# Patient Record
Sex: Male | Born: 1970 | Hispanic: No | Marital: Married | Smoking: Never smoker
Health system: Southern US, Community
[De-identification: ages and names within clinical notes are randomized; demographics above are authoritative.]

## PROBLEM LIST (undated history)

## (undated) DIAGNOSIS — E119 Type 2 diabetes mellitus without complications: Secondary | ICD-10-CM

## (undated) DIAGNOSIS — E785 Hyperlipidemia, unspecified: Secondary | ICD-10-CM

## (undated) DIAGNOSIS — I10 Essential (primary) hypertension: Secondary | ICD-10-CM

## (undated) HISTORY — DX: Hyperlipidemia, unspecified: E78.5

## (undated) HISTORY — DX: Essential (primary) hypertension: I10

---

## 2012-05-27 ENCOUNTER — Encounter (INDEPENDENT_AMBULATORY_CARE_PROVIDER_SITE_OTHER): Payer: Self-pay | Admitting: Surgery

## 2012-05-27 ENCOUNTER — Ambulatory Visit (INDEPENDENT_AMBULATORY_CARE_PROVIDER_SITE_OTHER): Payer: Commercial Managed Care - PPO | Admitting: Surgery

## 2012-05-27 VITALS — BP 136/90 | HR 80 | Temp 98.7°F | Resp 15 | Ht 72.0 in | Wt 209.8 lb

## 2012-05-27 DIAGNOSIS — L723 Sebaceous cyst: Secondary | ICD-10-CM

## 2012-05-27 NOTE — Progress Notes (Signed)
NAMEBRANDUN PINN DOB: [birth year] MRN: [id number]                                                                                      DATE: 05/27/2012  PCP: Gildardo Griffes, MD Referring Provider: Roberts Gaudy., MD  IMPRESSION:  Epidermoid cyst right upper back recently inflamed, 1.5 cm in size  PLAN:   excision of epidermoid cyst of the back under local anesthesia. I discussed this with the patient and we talked about alternatives which basically just continued followup. We talked about doing this under local anesthesia and that he could resume normal activities the same day. He would need to restrict significant physical activity such as golf or tennis for a week or 2 after surgery.                 CC:  Chief Complaint  Patient presents with  . Cyst    of back    HPI:  Charles [name] is a 41 y.o.  male who presents for evaluation of a cyst of the back. This reason a drain a little bit but was treated with some antibiotic cream and settled down. Is been present for a while. He is currently asymptomatic. He has noticed a small open area over the top of the cystic mass.  PMH:  has a past medical history of Hyperlipidemia and Hypertension.  PSH:   has no past surgical history on file.  ALLERGIES:  No Known Allergies  MEDICATIONS: Current outpatient prescriptions:GEMFIBROZIL PO, Take by mouth., Disp: , Rfl: ;  lisinopril (PRINIVIL,ZESTRIL) 5 MG tablet, Take 5 mg by mouth daily., Disp: , Rfl:   ROS: He has filled out our 12 point review of systems and it is negative  . EXAM:   Gen.: Patient alert oriented healthy-appearing no acute distress. Vital signs:BP 136/90  Pulse 80  Temp 98.7 F (37.1 C)  Resp 15  Ht 6' (1.829 m)  Wt 209 lb 12.8 oz (95.165 kg)  BMI 28.45 kg/m2 Back: On the upper right back is a 1.5 cm epidermoid cyst well circumscribed with a small opening onto the skin. It is adjacent to a transverse large tattoo.  DATA REVIEWED:  None     Starleen Trussell  J 05/27/2012  CC: Roberts Gaudy., MD, Gildardo Griffes, MD

## 2012-05-27 NOTE — Patient Instructions (Signed)
We will schedule outpatient surgery to remove the cyst from your back using local anesthesia

## 2012-06-17 ENCOUNTER — Ambulatory Visit (HOSPITAL_BASED_OUTPATIENT_CLINIC_OR_DEPARTMENT_OTHER)
Admission: RE | Admit: 2012-06-17 | Discharge: 2012-06-17 | Disposition: A | Discharge status: Home / Self Care | Payer: 59 | Source: Ambulatory Visit | Attending: Surgery | Admitting: Surgery

## 2012-06-17 ENCOUNTER — Encounter (HOSPITAL_BASED_OUTPATIENT_CLINIC_OR_DEPARTMENT_OTHER)
Admission: RE | Disposition: A | Discharge status: Home / Self Care | Payer: Self-pay | Source: Ambulatory Visit | Attending: Surgery

## 2012-06-17 DIAGNOSIS — L723 Sebaceous cyst: Secondary | ICD-10-CM | POA: Insufficient documentation

## 2012-06-17 DIAGNOSIS — I10 Essential (primary) hypertension: Secondary | ICD-10-CM | POA: Insufficient documentation

## 2012-06-17 HISTORY — PX: MASS EXCISION: SHX2000

## 2012-06-17 SURGERY — MINOR EXCISION OF MASS
Anesthesia: LOCAL | Site: Back | Wound class: Clean

## 2012-06-17 MED ORDER — LIDOCAINE-EPINEPHRINE (PF) 1 %-1:200000 IJ SOLN
INTRAMUSCULAR | Status: DC | PRN
  Administered 2012-06-17: 3.5 mL

## 2012-06-17 MED ORDER — SODIUM BICARBONATE 4 % IV SOLN
INTRAVENOUS | Status: DC | PRN
  Administered 2012-06-17: 1 mL via INTRAVENOUS

## 2012-06-17 SURGICAL SUPPLY — 35 items
BENZOIN TINCTURE PRP APPL 2/3 (GAUZE/BANDAGES/DRESSINGS) IMPLANT
BLADE SURG 15 STRL LF DISP TIS (BLADE) ×1 IMPLANT
BLADE SURG 15 STRL SS (BLADE) ×1
CLOTH BEACON ORANGE TIMEOUT ST (SAFETY) ×2 IMPLANT
DERMABOND ADVANCED (GAUZE/BANDAGES/DRESSINGS) ×1
DERMABOND ADVANCED .7 DNX12 (GAUZE/BANDAGES/DRESSINGS) ×1 IMPLANT
DRAPE SURG 17X23 STRL (DRAPES) IMPLANT
DRSG TEGADERM 4X4.75 (GAUZE/BANDAGES/DRESSINGS) IMPLANT
ELECT REM PT RETURN 9FT ADLT (ELECTROSURGICAL)
ELECTRODE REM PT RTRN 9FT ADLT (ELECTROSURGICAL) IMPLANT
GAUZE SPONGE 4X4 12PLY STRL LF (GAUZE/BANDAGES/DRESSINGS) IMPLANT
GAUZE SPONGE 4X4 16PLY XRAY LF (GAUZE/BANDAGES/DRESSINGS) IMPLANT
GLOVE EUDERMIC 7 POWDERFREE (GLOVE) ×2 IMPLANT
GLOVE SKINSENSE NS SZ7.0 (GLOVE) ×1
GLOVE SKINSENSE STRL SZ7.0 (GLOVE) ×1 IMPLANT
MARKER SKIN DUAL TIP RULER LAB (MISCELLANEOUS) ×2 IMPLANT
NDL SAFETY ECLIPSE 18X1.5 (NEEDLE) ×1 IMPLANT
NEEDLE HYPO 18GX1.5 SHARP (NEEDLE) ×1
NEEDLE HYPO 25X1 1.5 SAFETY (NEEDLE) ×2 IMPLANT
PENCIL BUTTON HOLSTER BLD 10FT (ELECTRODE) IMPLANT
STRIP CLOSURE SKIN 1/2X4 (GAUZE/BANDAGES/DRESSINGS) IMPLANT
SUT ETHILON 3 0 PS 1 (SUTURE) IMPLANT
SUT ETHILON 4 0 PS 2 18 (SUTURE) IMPLANT
SUT MNCRL AB 3-0 PS2 18 (SUTURE) ×2 IMPLANT
SUT MNCRL AB 4-0 PS2 18 (SUTURE) IMPLANT
SUT PROLENE 3 0 PS 2 (SUTURE) IMPLANT
SUT PROLENE 5 0 P 3 (SUTURE) IMPLANT
SUT VIC AB 3-0 FS2 27 (SUTURE) IMPLANT
SUT VIC AB 4-0 BRD 54 (SUTURE) IMPLANT
SUT VIC AB 4-0 P-3 18XBRD (SUTURE) IMPLANT
SUT VIC AB 4-0 P3 18 (SUTURE)
SUT VIC AB 4-0 SH 18 (SUTURE) IMPLANT
SWABSTICK POVIDONE IODINE SNGL (MISCELLANEOUS) ×4 IMPLANT
SYR CONTROL 10ML LL (SYRINGE) ×2 IMPLANT
TOWEL OR 17X24 6PK STRL BLUE (TOWEL DISPOSABLE) IMPLANT

## 2012-06-17 NOTE — H&P (View-Only) (Signed)
NAME: Charles [name] DOB: 04/07/1971 MRN: [id number]                                                                                      DATE: 05/27/2012  PCP: BURNS,KEVIN, MD Referring Provider: Burns, Kevin L., MD  IMPRESSION:  Epidermoid cyst right upper back recently inflamed, 1.5 cm in size  PLAN:   excision of epidermoid cyst of the back under local anesthesia. I discussed this with the patient and we talked about alternatives which basically just continued followup. We talked about doing this under local anesthesia and that he could resume normal activities the same day. He would need to restrict significant physical activity such as golf or tennis for a week or 2 after surgery.                 CC:  Chief Complaint  Patient presents with  . Cyst    of back    HPI:  Charles [name] is a 40 y.o.  male who presents for evaluation of a cyst of the back. This reason a drain a little bit but was treated with some antibiotic cream and settled down. Is been present for a while. He is currently asymptomatic. He has noticed a small open area over the top of the cystic mass.  PMH:  has a past medical history of Hyperlipidemia and Hypertension.  PSH:   has no past surgical history on file.  ALLERGIES:  No Known Allergies  MEDICATIONS: Current outpatient prescriptions:GEMFIBROZIL PO, Take by mouth., Disp: , Rfl: ;  lisinopril (PRINIVIL,ZESTRIL) 5 MG tablet, Take 5 mg by mouth daily., Disp: , Rfl:   ROS: He has filled out our 12 point review of systems and it is negative  . EXAM:   Gen.: Patient alert oriented healthy-appearing no acute distress. Vital signs:BP 136/90  Pulse 80  Temp 98.7 F (37.1 C)  Resp 15  Ht 6' (1.829 m)  Wt 209 lb 12.8 oz (95.165 kg)  BMI 28.45 kg/m2 Back: On the upper right back is a 1.5 cm epidermoid cyst well circumscribed with a small opening onto the skin. It is adjacent to a transverse large tattoo.  DATA REVIEWED:  None     Aneshia Jacquet  J 05/27/2012  CC: Burns, Kevin L., MD, BURNS,KEVIN, MD        

## 2012-06-17 NOTE — Interval H&P Note (Signed)
History and Physical Interval Note:  06/17/2012 10:47 AM  Charles [name]  has presented today for surgery, with the diagnosis of epidermal cyst  The various methods of treatment have been discussed with the patient and family. After consideration of risks, benefits and other options for treatment, the patient has consented to  Procedure(s) (LRB) with comments: MINOR EXCISION OF MASS (N/A) - removal cyst of back as a surgical intervention .  The patient's history has been reviewed, patient examined, no change in status, stable for surgery.  I have reviewed the patient's chart and labs.  Questions were answered to the patient's satisfaction.     [name]

## 2012-06-17 NOTE — Op Note (Signed)
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   Clinical History and Indications: this patient has an epidermoid cyst which has become intermittently inflamed right upper back. He was scheduled electively for excision to prevent recurrences. It was currently not acutely inflamed   Description of Procedure: the patient was seen at the area and the mass identified. It was marked. The patient was taken to the operating room and after a time out was performed the area was infiltrated with 1% Xylocaine with epinephrine. I waited 10 minutes and had excellent anesthesia. I made a short elliptical incision and excised the mass sharply with a knife. It came out intact and in toto. The incision was not bleeding. It was closed with 3-0 Monocryl subcuticular plus Dermabond.  The patient tolerated the procedure well. There no operative complications. All counts were correct.  Currie Paris, MD, FACS 06/17/2012 11:23 AM

## 2012-06-18 ENCOUNTER — Encounter (HOSPITAL_BASED_OUTPATIENT_CLINIC_OR_DEPARTMENT_OTHER): Payer: Self-pay

## 2012-06-18 ENCOUNTER — Encounter (HOSPITAL_BASED_OUTPATIENT_CLINIC_OR_DEPARTMENT_OTHER): Payer: Self-pay | Admitting: Surgery

## 2012-07-04 ENCOUNTER — Encounter (INDEPENDENT_AMBULATORY_CARE_PROVIDER_SITE_OTHER): Payer: Self-pay | Admitting: Surgery

## 2012-07-04 ENCOUNTER — Ambulatory Visit (INDEPENDENT_AMBULATORY_CARE_PROVIDER_SITE_OTHER): Payer: Commercial Managed Care - PPO | Admitting: Surgery

## 2012-07-04 VITALS — BP 150/90 | HR 72 | Temp 97.4°F | Resp 20 | Ht 72.0 in | Wt 211.8 lb

## 2012-07-04 DIAGNOSIS — Z9889 Other specified postprocedural states: Secondary | ICD-10-CM

## 2012-07-04 DIAGNOSIS — L723 Sebaceous cyst: Secondary | ICD-10-CM

## 2012-07-04 NOTE — Patient Instructions (Addendum)
We will see you again on an as needed basis. Please call the office at [phone] if you have any questions or concerns. Thank you for allowing us to take care of you.  

## 2012-07-04 NOTE — Progress Notes (Signed)
NAME: Charles [name]                                            DOB: 08-09-1971 DATE: 07/04/2012                                                  MRN: [id number]  CC: Post op   HPI: This patient comes in for post op follow-up .Heunderwent removal of small epidermoid cyst from the right upper back on 06/17/2012. He feels that he is doing well.  PE:  VITAL SIGNS: BP 150/90  Pulse 72  Temp 97.4 F (36.3 C) (Temporal)  Resp 20  Ht 6' (1.829 m)  Wt 211 lb 12.8 oz (96.072 kg)  BMI 28.73 kg/m2  General: The patient appears to be healthy, NAD Incision is healing nicely  DATA REVIEWED: Pathology report showed some scarring and inflammatory changes or chronic  IMPRESSION: The patient is doing well S/P cyst removed    PLAN: Recheck here on an as-needed basis. Discuss his path report and a copy.

## 2013-09-21 ENCOUNTER — Encounter: Payer: Self-pay | Admitting: Oncology

## 2013-09-21 NOTE — Progress Notes (Signed)
This chart was opened by error

## 2013-12-02 ENCOUNTER — Encounter (INDEPENDENT_AMBULATORY_CARE_PROVIDER_SITE_OTHER): Payer: Self-pay | Admitting: General Surgery

## 2013-12-02 ENCOUNTER — Ambulatory Visit (INDEPENDENT_AMBULATORY_CARE_PROVIDER_SITE_OTHER): Payer: Commercial Managed Care - PPO | Admitting: General Surgery

## 2013-12-02 VITALS — BP 128/76 | HR 68 | Temp 98.3°F | Resp 15 | Ht 72.0 in | Wt 226.0 lb

## 2013-12-02 DIAGNOSIS — L723 Sebaceous cyst: Secondary | ICD-10-CM | POA: Insufficient documentation

## 2013-12-02 NOTE — Patient Instructions (Signed)
Please call [phone] and ask for scheduling if you decide to go ahead with surgery.

## 2013-12-02 NOTE — Progress Notes (Signed)
Patient ID: Charles [name], male   DOB: Aug 31, 1971, 43 y.o.   MRN: [id number]  Chief Complaint  Patient presents with  . New Evaluation    eval cyst upper right back    HPI Charles [name] is a 43 y.o. male.  Chief complaint: Recurrent sebaceous cyst right back HPI Patient is known to Dr. Margot Chimes from our practice status post excision of sebaceous cyst of the right back in 2013. The area has been getting larger recently and his primary care physician was concerned it had recurred.  He scheduled an appointment for reevaluation. Of note, he has developed an additional small area on his right posterior neck that he would like evaluated. There is occasional drainage of material from the skin but there is been no acute pain or redness recently. Past Medical History  Diagnosis Date  . Hyperlipidemia   . Hypertension     Past Surgical History  Procedure Laterality Date  . Mass excision  06/17/2012    Procedure: MINOR EXCISION OF MASS;  Surgeon: Haywood Lasso, MD;  Location: [facility];  Service: General;  Laterality: N/A;  Removal Cyst of Right Upper Back    Family History  Problem Relation Age of Onset  . Lymphoma Mother   . Cancer Mother     lymphoma  . Heart disease Father     Social History History  Substance Use Topics  . Smoking status: Never Smoker   . Smokeless tobacco: Never Used  . Alcohol Use: Yes    No Known Allergies  Current Outpatient Prescriptions  Medication Sig Dispense Refill  . GEMFIBROZIL PO Take by mouth.      Marland Kitchen lisinopril (PRINIVIL,ZESTRIL) 5 MG tablet Take 5 mg by mouth daily.       No current facility-administered medications for this visit.    Review of Systems Review of Systems  Constitutional: Negative for fever, chills and unexpected weight change.  HENT: Negative for congestion, hearing loss, sore throat, trouble swallowing and voice change.   Eyes: Negative for visual disturbance.  Respiratory: Negative for cough and  wheezing.   Cardiovascular: Negative for chest pain, palpitations and leg swelling.  Gastrointestinal: Negative for nausea, vomiting, abdominal pain, diarrhea, constipation, blood in stool, abdominal distention, anal bleeding and rectal pain.  Genitourinary: Negative for hematuria and difficulty urinating.  Musculoskeletal: Negative for arthralgias.  Skin: Negative for rash and wound.       See history of present illness  Neurological: Negative for seizures, syncope, weakness and headaches.  Hematological: Negative for adenopathy. Does not bruise/bleed easily.  Psychiatric/Behavioral: Negative for confusion.    Blood pressure 128/76, pulse 68, temperature 98.3 F (36.8 C), temperature source Oral, resp. rate 15, height 6' (1.829 m), weight 226 lb (102.513 kg).  Physical Exam Physical Exam  Constitutional: He is oriented to person, place, and time. He appears well-developed and well-nourished. No distress.  HENT:  Head: Normocephalic and atraumatic.  Eyes: EOM are normal. Pupils are equal, round, and reactive to light.  Neck: Normal range of motion. Neck supple. No tracheal deviation present. No thyromegaly present.  Right posterior neck has 8 mm palpable subcutaneous mass without overlying skin changes  Cardiovascular: Normal rate and normal heart sounds.   Pulmonary/Chest: Effort normal and breath sounds normal. No stridor. No respiratory distress. He has no wheezes. He has no rales.    2 cm subcutaneous mass beneath previous scar right back at lateral edge of tattoo  Abdominal: Soft. Bowel sounds are normal. He  exhibits no distension. There is no tenderness. There is no rebound.  Musculoskeletal: Normal range of motion.  Neurological: He is alert and oriented to person, place, and time.  Skin: Skin is warm.  See above   Assessment    Recurrent sebaceous cyst right back, sebaceous cyst right posterior neck    Plan    I have offered excision of both in the operating room  under general anesthesia in prone position. Procedure, risks, benefits were discussed in detail with him and his wife. He is reluctant to have further surgery in the back to think about it. If he decides to go forward he will call.    Brendaliz Kuk E 12/02/2013, 12:21 PM

## 2015-04-19 ENCOUNTER — Other Ambulatory Visit (HOSPITAL_COMMUNITY): Payer: Self-pay | Admitting: Otolaryngology

## 2015-04-19 DIAGNOSIS — G519 Disorder of facial nerve, unspecified: Secondary | ICD-10-CM

## 2015-04-19 DIAGNOSIS — H6521 Chronic serous otitis media, right ear: Secondary | ICD-10-CM

## 2015-04-19 DIAGNOSIS — H6061 Unspecified chronic otitis externa, right ear: Secondary | ICD-10-CM

## 2015-10-03 MED FILL — GEMFIBROZIL 600 MG TABLET: 600 | 90 days supply | Qty: 180 | Fill #1

## 2015-10-31 DIAGNOSIS — J3089 Other allergic rhinitis: Secondary | ICD-10-CM | POA: Diagnosis not present

## 2015-10-31 DIAGNOSIS — E785 Hyperlipidemia, unspecified: Secondary | ICD-10-CM | POA: Diagnosis not present

## 2015-10-31 DIAGNOSIS — I10 Essential (primary) hypertension: Secondary | ICD-10-CM | POA: Diagnosis not present

## 2015-10-31 MED FILL — IPRATROPIUM 0.03% SPRAY: 0.03 | 30 days supply | Qty: 30 | Fill #0

## 2015-11-15 DIAGNOSIS — R7989 Other specified abnormal findings of blood chemistry: Secondary | ICD-10-CM | POA: Diagnosis not present

## 2015-11-15 DIAGNOSIS — I10 Essential (primary) hypertension: Secondary | ICD-10-CM | POA: Diagnosis not present

## 2015-11-30 MED FILL — LISINOPRIL 5 MG TABLET: 5 | 90 days supply | Qty: 90 | Fill #0

## 2015-12-20 DIAGNOSIS — R945 Abnormal results of liver function studies: Secondary | ICD-10-CM | POA: Diagnosis not present

## 2015-12-20 DIAGNOSIS — L259 Unspecified contact dermatitis, unspecified cause: Secondary | ICD-10-CM | POA: Diagnosis not present

## 2015-12-20 DIAGNOSIS — I10 Essential (primary) hypertension: Secondary | ICD-10-CM | POA: Diagnosis not present

## 2015-12-20 MED FILL — predniSONE 20 MG TABS: 20 | 3 days supply | Qty: 6 | Fill #0

## 2015-12-20 MED FILL — TRIAMCINOLONE 0.1% CREAM: 0.1 | 10 days supply | Qty: 30 | Fill #0

## 2015-12-26 DIAGNOSIS — L237 Allergic contact dermatitis due to plants, except food: Secondary | ICD-10-CM | POA: Diagnosis not present

## 2015-12-26 MED FILL — predniSONE 5 MG TABS: 5 | 12 days supply | Qty: 42 | Fill #0

## 2016-01-23 DIAGNOSIS — R945 Abnormal results of liver function studies: Secondary | ICD-10-CM | POA: Diagnosis not present

## 2016-04-02 MED FILL — IPRATROPIUM 0.03% SPRAY: 0.03 | 30 days supply | Qty: 30 | Fill #1

## 2016-04-27 MED FILL — LISINOPRIL 5 MG TABLET: 5 | 90 days supply | Qty: 90 | Fill #1

## 2016-07-03 DIAGNOSIS — E785 Hyperlipidemia, unspecified: Secondary | ICD-10-CM | POA: Diagnosis not present

## 2016-07-03 DIAGNOSIS — I10 Essential (primary) hypertension: Secondary | ICD-10-CM | POA: Diagnosis not present

## 2016-07-05 MED FILL — PRAVASTATIN NA 10 MG TAB: 10 | 30 days supply | Qty: 30 | Fill #0

## 2016-07-10 MED FILL — OMEGA-3 ETHYL ESTERS 1 GM C: 1 | 30 days supply | Qty: 60 | Fill #0

## 2016-08-06 MED FILL — LISINOPRIL 5 MG TABLET: 5 | 90 days supply | Qty: 90 | Fill #0

## 2016-08-06 MED FILL — OMEGA-3 ETHYL ESTERS 1 GM C: 1 | 30 days supply | Qty: 60 | Fill #1

## 2016-08-06 MED FILL — PRAVASTATIN NA 10 MG TAB: 10 | 30 days supply | Qty: 30 | Fill #1

## 2016-09-05 MED FILL — PRAVASTATIN NA 10 MG TAB: 10 | 30 days supply | Qty: 30 | Fill #2

## 2016-09-05 MED FILL — OMEGA-3 ETHYL ESTERS 1 GM C: 1 | 30 days supply | Qty: 60 | Fill #2

## 2016-09-21 DIAGNOSIS — J019 Acute sinusitis, unspecified: Secondary | ICD-10-CM | POA: Diagnosis not present

## 2016-10-10 MED FILL — OMEGA-3 ETHYL ESTERS 1 GM C: 1 | 30 days supply | Qty: 60 | Fill #3

## 2016-10-10 MED FILL — PRAVASTATIN NA 10 MG TAB: 10 | 30 days supply | Qty: 30 | Fill #3

## 2016-11-01 MED FILL — LISINOPRIL 5 MG TABLET: 5 | 90 days supply | Qty: 90 | Fill #1

## 2016-11-05 MED FILL — PRAVASTATIN NA 10 MG TAB: 10 | 30 days supply | Qty: 30 | Fill #4

## 2016-11-05 MED FILL — OMEGA-3 ETHYL ESTERS 1 GM C: 1 | 30 days supply | Qty: 60 | Fill #4

## 2016-11-12 DIAGNOSIS — M545 Low back pain: Secondary | ICD-10-CM | POA: Diagnosis not present

## 2016-11-12 DIAGNOSIS — I10 Essential (primary) hypertension: Secondary | ICD-10-CM | POA: Diagnosis not present

## 2016-11-12 MED FILL — MELOXICAM 7.5 MG TABLET: 7.5 | 60 days supply | Qty: 60 | Fill #0

## 2016-11-12 MED FILL — GABAPENTIN 300 MG CAPSULE: 300 | 30 days supply | Qty: 90 | Fill #0

## 2016-12-19 MED FILL — PRAVASTATIN NA 10 MG TAB: 10 | 30 days supply | Qty: 30 | Fill #5

## 2016-12-19 MED FILL — OMEGA-3 ETHYL ESTERS 1 GM C: 1 | 30 days supply | Qty: 60 | Fill #5

## 2017-01-21 MED FILL — OMEGA-3 ETHYL ESTERS 1 GM C: 1 | 30 days supply | Qty: 60 | Fill #6

## 2017-01-21 MED FILL — PRAVASTATIN NA 10 MG TAB: 10 | 90 days supply | Qty: 90 | Fill #0

## 2017-01-22 DIAGNOSIS — E785 Hyperlipidemia, unspecified: Secondary | ICD-10-CM | POA: Diagnosis not present

## 2017-01-22 DIAGNOSIS — I10 Essential (primary) hypertension: Secondary | ICD-10-CM | POA: Diagnosis not present

## 2017-01-22 DIAGNOSIS — Z0000 Encounter for general adult medical examination without abnormal findings: Secondary | ICD-10-CM | POA: Diagnosis not present

## 2017-01-22 MED FILL — LISINOPRIL 10 MG TABLET: 10 | 90 days supply | Qty: 90 | Fill #0

## 2017-01-22 MED FILL — FLUTICASONE PROP 50 MCG SPR: 50 | 60 days supply | Qty: 16 | Fill #0

## 2017-02-25 MED FILL — OMEGA-3 ETHYL ESTERS 1 GM C: 1 | 30 days supply | Qty: 60 | Fill #7

## 2017-04-01 MED FILL — OMEGA-3 ETHYL ESTERS 1 GM C: 1 | 30 days supply | Qty: 60 | Fill #8

## 2017-04-24 MED FILL — LISINOPRIL 10 MG TABLET: 10 | 90 days supply | Qty: 90 | Fill #1

## 2017-05-06 MED FILL — OMEGA-3 ETHYL ESTERS 1 GM C: 1 | 30 days supply | Qty: 60 | Fill #9

## 2017-05-22 MED FILL — PRAVASTATIN NA 10 MG TAB: 10 | 90 days supply | Qty: 90 | Fill #1

## 2017-06-12 MED FILL — OMEGA-3 ETHYL ESTERS 1 GM C: 1 | 30 days supply | Qty: 60 | Fill #10

## 2017-07-23 MED FILL — OMEGA-3 ETHYL ESTERS 1 GM C: 1 | 30 days supply | Qty: 60 | Fill #0

## 2017-07-23 MED FILL — LISINOPRIL 10 MG TABLET: 10 | 90 days supply | Qty: 90 | Fill #2

## 2017-07-30 DIAGNOSIS — I10 Essential (primary) hypertension: Secondary | ICD-10-CM | POA: Diagnosis not present

## 2017-07-30 DIAGNOSIS — E785 Hyperlipidemia, unspecified: Secondary | ICD-10-CM | POA: Diagnosis not present

## 2017-08-27 DIAGNOSIS — H52203 Unspecified astigmatism, bilateral: Secondary | ICD-10-CM | POA: Diagnosis not present

## 2017-08-27 DIAGNOSIS — H5213 Myopia, bilateral: Secondary | ICD-10-CM | POA: Diagnosis not present

## 2017-08-27 DIAGNOSIS — H524 Presbyopia: Secondary | ICD-10-CM | POA: Diagnosis not present

## 2017-08-29 DIAGNOSIS — B360 Pityriasis versicolor: Secondary | ICD-10-CM | POA: Diagnosis not present

## 2017-08-29 DIAGNOSIS — L918 Other hypertrophic disorders of the skin: Secondary | ICD-10-CM | POA: Diagnosis not present

## 2017-08-29 MED FILL — FLUCONAZOLE 150 MG TABLET: 150 | 2 days supply | Qty: 2 | Fill #0

## 2017-09-16 MED FILL — OMEGA-3 ETHYL ESTERS 1 GM C: 1 | 30 days supply | Qty: 60 | Fill #1

## 2017-10-30 MED FILL — PRAVASTATIN NA 10 MG TAB: 10 | 90 days supply | Qty: 90 | Fill #0

## 2017-10-30 MED FILL — OMEGA-3 ETHYL ESTERS 1 GM C: 1 | 30 days supply | Qty: 60 | Fill #2

## 2017-10-30 MED FILL — LISINOPRIL 10 MG TABS: 10 | 90 days supply | Qty: 90 | Fill #3

## 2017-11-05 DIAGNOSIS — M25562 Pain in left knee: Secondary | ICD-10-CM | POA: Diagnosis not present

## 2017-11-05 MED FILL — MELOXICAM 15 MG TABLET: 15 | 30 days supply | Qty: 30 | Fill #0

## 2017-12-11 MED FILL — OMEGA-3 ETHYL ESTERS 1 GM C: 1 | 30 days supply | Qty: 60 | Fill #3

## 2018-01-22 MED FILL — OMEGA-3 ETHYL ESTERS 1 GM C: 1 | 30 days supply | Qty: 60 | Fill #4

## 2018-02-03 MED FILL — LISINOPRIL 10 MG TABLET: 10 | 90 days supply | Qty: 90 | Fill #0

## 2018-02-03 MED FILL — PRAVASTATIN SODIUM 10 MG TA: 10 | 90 days supply | Qty: 90 | Fill #0

## 2018-02-11 DIAGNOSIS — Z13220 Encounter for screening for lipoid disorders: Secondary | ICD-10-CM | POA: Diagnosis not present

## 2018-02-11 DIAGNOSIS — I10 Essential (primary) hypertension: Secondary | ICD-10-CM | POA: Diagnosis not present

## 2018-02-11 DIAGNOSIS — Z0000 Encounter for general adult medical examination without abnormal findings: Secondary | ICD-10-CM | POA: Diagnosis not present

## 2018-02-11 MED FILL — FLUCONAZOLE 100 MG TABLET: 100 | 7 days supply | Qty: 6 | Fill #0

## 2018-02-28 MED FILL — OMEGA-3 ETHYL ESTERS 1 GM C: 1 | 30 days supply | Qty: 60 | Fill #5

## 2018-04-17 MED FILL — OMEGA-3 ETHYL ESTER 1 GM CA: 1 | 30 days supply | Qty: 60 | Fill #6

## 2018-05-05 MED FILL — LISINOPRIL 10 MG TABLET: 10 | 90 days supply | Qty: 90 | Fill #0

## 2018-05-23 MED FILL — KETOCONAZOLE 2% CREAM: 2 | 30 days supply | Qty: 30 | Fill #0

## 2018-06-09 MED FILL — OMEGA-3 ETHYL ESTER 1 GM CA: 1 | 30 days supply | Qty: 60 | Fill #7

## 2018-06-24 MED FILL — PRAVASTATIN SODIUM 10 MG TA: 10 | 90 days supply | Qty: 90 | Fill #0

## 2018-07-16 MED FILL — OMEGA-3 ETHYL ESTER 1 GM CA: 1 | 30 days supply | Qty: 60 | Fill #8

## 2018-08-06 MED FILL — LISINOPRIL 10 MG TABLET: 10 | 90 days supply | Qty: 90 | Fill #1

## 2018-08-20 MED FILL — OMEGA-3 ETHYL ESTER 1 GM CA: 1 | 30 days supply | Qty: 60 | Fill #0

## 2018-09-23 MED FILL — PRAVASTATIN SODIUM 10 MG TA: 10 | 90 days supply | Qty: 90 | Fill #1

## 2018-09-23 MED FILL — OMEGA-3 ETHYL ESTER 1 GM CA: 1 | 30 days supply | Qty: 60 | Fill #1

## 2018-11-06 MED FILL — OMEGA-3 ETHYL ESTER 1 GM CA: 1 | 30 days supply | Qty: 60 | Fill #2 | Status: TO

## 2018-11-19 MED FILL — LISINOPRIL 10 MG TABLET: 10 | 90 days supply | Qty: 90 | Fill #2

## 2018-12-08 MED FILL — OMEGA-3 ETHYL ESTERS 1 GM C: 1 | 90 days supply | Qty: 180 | Fill #0

## 2018-12-08 MED FILL — PRAVASTATIN SODIUM 10 MG TA: 10 | 90 days supply | Qty: 90 | Fill #0

## 2019-03-10 MED FILL — LISINOPRIL 10 MG TABLET: 10 | 30 days supply | Qty: 30 | Fill #0

## 2019-03-13 MED FILL — PHENTERMINE 37.5 MG TABLET: 37.5 | 30 days supply | Qty: 30 | Fill #0

## 2019-04-20 MED FILL — OMEGA-3-ACID ETHYL ESTERS 1: 1 | 90 days supply | Qty: 180 | Fill #0

## 2019-04-23 MED FILL — PHENTERMINE 37.5 MG TABLET: 37.5 | 30 days supply | Qty: 30 | Fill #0

## 2019-05-06 DIAGNOSIS — I10 Essential (primary) hypertension: Secondary | ICD-10-CM | POA: Diagnosis not present

## 2019-05-06 DIAGNOSIS — Z0000 Encounter for general adult medical examination without abnormal findings: Secondary | ICD-10-CM | POA: Diagnosis not present

## 2019-05-06 MED FILL — LISINOPRIL 10 MG TABLET: 10 | 90 days supply | Qty: 90 | Fill #0

## 2019-05-06 MED FILL — PRAVASTATIN SODIUM 10 MG TA: 10 | 90 days supply | Qty: 90 | Fill #0

## 2019-05-06 MED FILL — FLUCONAZOLE 100 MG TABLET: 100 | 14 days supply | Qty: 6 | Fill #0

## 2019-06-22 MED FILL — MELOXICAM 15 MG TABLET: 15 | 30 days supply | Qty: 30 | Fill #0

## 2019-06-24 DIAGNOSIS — E785 Hyperlipidemia, unspecified: Secondary | ICD-10-CM | POA: Insufficient documentation

## 2019-06-24 DIAGNOSIS — G5132 Clonic hemifacial spasm, left: Secondary | ICD-10-CM | POA: Diagnosis not present

## 2019-06-24 DIAGNOSIS — G245 Blepharospasm: Secondary | ICD-10-CM | POA: Diagnosis not present

## 2019-06-24 DIAGNOSIS — I10 Essential (primary) hypertension: Secondary | ICD-10-CM | POA: Insufficient documentation

## 2019-06-24 MED FILL — tiZANidine HCL 2 MG TABS: 2 | 15 days supply | Qty: 30 | Fill #0

## 2019-07-01 MED FILL — PHENTERMINE 37.5 MG TABLET: 37.5 | 30 days supply | Qty: 30 | Fill #0

## 2019-07-09 MED FILL — tiZANidine HCL 4 MG TABS: 4 | 30 days supply | Qty: 60 | Fill #0

## 2019-07-16 MED FILL — FLUCONAZOLE 100 MG TAB: 100 | 14 days supply | Qty: 6 | Fill #1

## 2019-08-11 MED FILL — tiZANidine HCL 4 MG TABS: 4 | 30 days supply | Qty: 60 | Fill #1

## 2019-08-11 MED FILL — OMEGA-3-ACID ETHYL ESTERS 1: 1 | 90 days supply | Qty: 180 | Fill #1

## 2019-08-24 MED FILL — LISINOPRIL 10 MG TABS: 10 | 90 days supply | Qty: 90 | Fill #1

## 2019-09-22 MED FILL — tiZANidine HCL 4 MG TABS: 4 | 30 days supply | Qty: 60 | Fill #2

## 2019-09-30 MED FILL — PRAVASTATIN SODIUM 10 MG TA: 10 | 90 days supply | Qty: 90 | Fill #1

## 2019-10-11 ENCOUNTER — Ambulatory Visit: Payer: 59 | Attending: Internal Medicine

## 2019-10-11 DIAGNOSIS — Z23 Encounter for immunization: Secondary | ICD-10-CM | POA: Insufficient documentation

## 2019-10-11 NOTE — Progress Notes (Signed)
   Covid-19 Vaccination Clinic  Name:  Charles [name]    MRN: [id number] DOB: 17-Oct-1970  10/11/2019  [name] was observed post Covid-19 immunization for 15 minutes without incidence. He was provided with Vaccine Information Sheet and instruction to access the V-Safe system.   [name] was instructed to call 911 with any severe reactions post vaccine: Marland Kitchen Difficulty breathing  . Swelling of your face and throat  . A fast heartbeat  . A bad rash all over your body  . Dizziness and weakness    Immunizations Administered    Name Date Dose VIS Date Route   Pfizer COVID-19 Vaccine 10/11/2019  3:08 PM 0.3 mL 08/28/2019 Intramuscular   Manufacturer: [place]   Lot: [id number]   [place]: [id number]

## 2019-10-16 MED FILL — PHENTERMINE 37.5 MG TABLET: 37.5 | 30 days supply | Qty: 30 | Fill #0

## 2019-10-30 ENCOUNTER — Ambulatory Visit: Payer: 59 | Attending: Internal Medicine

## 2019-10-30 DIAGNOSIS — Z23 Encounter for immunization: Secondary | ICD-10-CM | POA: Insufficient documentation

## 2019-10-30 MED FILL — IBUPROFEN 800 MG TAB: 800 | 7 days supply | Qty: 30 | Fill #0

## 2019-10-30 NOTE — Progress Notes (Signed)
   Covid-19 Vaccination Clinic  Name:  Charles [name]    MRN: [id number] DOB: 1971-09-17  10/30/2019  [name] was observed post Covid-19 immunization for 15 minutes without incidence. He was provided with Vaccine Information Sheet and instruction to access the V-Safe system.   [name] was instructed to call 911 with any severe reactions post vaccine: Marland Kitchen Difficulty breathing  . Swelling of your face and throat  . A fast heartbeat  . A bad rash all over your body  . Dizziness and weakness    Immunizations Administered    Name Date Dose VIS Date Route   Pfizer COVID-19 Vaccine 10/30/2019  3:09 PM 0.3 mL 08/28/2019 Intramuscular   Manufacturer: [place]   Lot: [id number]   [employer]: [id number]

## 2019-11-04 MED FILL — tiZANidine HCL 4 MG TABS: 4 | 30 days supply | Qty: 60 | Fill #3

## 2019-11-18 DIAGNOSIS — G5132 Clonic hemifacial spasm, left: Secondary | ICD-10-CM | POA: Diagnosis not present

## 2019-11-18 MED FILL — tiZANidine HCL 4 MG TABS: 4 | 30 days supply | Qty: 120 | Fill #0

## 2019-12-03 MED FILL — LISINOPRIL 10 MG TABS: 10 | 90 days supply | Qty: 90 | Fill #2

## 2019-12-24 MED FILL — OMEGA-3-ACID ETHYL ESTERS 1: 1 | 90 days supply | Qty: 180 | Fill #0

## 2019-12-24 MED FILL — PRAVASTATIN SODIUM 10 MG TA: 10 | 90 days supply | Qty: 90 | Fill #2

## 2019-12-29 DIAGNOSIS — H52223 Regular astigmatism, bilateral: Secondary | ICD-10-CM | POA: Diagnosis not present

## 2019-12-29 DIAGNOSIS — H524 Presbyopia: Secondary | ICD-10-CM | POA: Diagnosis not present

## 2019-12-29 DIAGNOSIS — H5213 Myopia, bilateral: Secondary | ICD-10-CM | POA: Diagnosis not present

## 2020-02-08 MED FILL — FLUCONAZOLE 100 MG TABLET: 100 | 7 days supply | Qty: 6 | Fill #0

## 2020-02-10 MED FILL — tiZANidine HCL 4 MG TABS: 4 | 30 days supply | Qty: 120 | Fill #1

## 2020-03-14 MED FILL — tiZANidine HCL 4 MG TABS: 4 | 30 days supply | Qty: 120 | Fill #2

## 2020-05-11 MED FILL — PHENTERMINE 37.5 MG TABLET: 37.5 | 30 days supply | Qty: 30 | Fill #0

## 2020-05-17 MED FILL — OMEGA-3-ACID ETHYL ESTERS 1: 1 | 90 days supply | Qty: 180 | Fill #1

## 2020-06-15 MED FILL — LISINOPRIL 10 MG TABS: 10 | 90 days supply | Qty: 90 | Fill #0

## 2020-09-01 DIAGNOSIS — G8929 Other chronic pain: Secondary | ICD-10-CM | POA: Diagnosis not present

## 2020-09-01 DIAGNOSIS — D171 Benign lipomatous neoplasm of skin and subcutaneous tissue of trunk: Secondary | ICD-10-CM | POA: Diagnosis not present

## 2020-09-01 DIAGNOSIS — L723 Sebaceous cyst: Secondary | ICD-10-CM | POA: Diagnosis not present

## 2020-09-13 DIAGNOSIS — D171 Benign lipomatous neoplasm of skin and subcutaneous tissue of trunk: Secondary | ICD-10-CM | POA: Diagnosis not present

## 2020-09-13 DIAGNOSIS — L723 Sebaceous cyst: Secondary | ICD-10-CM | POA: Diagnosis not present

## 2020-09-13 DIAGNOSIS — G8929 Other chronic pain: Secondary | ICD-10-CM | POA: Diagnosis not present

## 2020-09-27 ENCOUNTER — Other Ambulatory Visit (HOSPITAL_BASED_OUTPATIENT_CLINIC_OR_DEPARTMENT_OTHER): Payer: Self-pay | Admitting: Internal Medicine

## 2020-09-27 MED FILL — OMEGA-3-ACID ETHYL ESTERS 1: 1 | 90 days supply | Qty: 180 | Fill #0

## 2020-09-29 DIAGNOSIS — L723 Sebaceous cyst: Secondary | ICD-10-CM | POA: Diagnosis not present

## 2020-09-29 DIAGNOSIS — G8929 Other chronic pain: Secondary | ICD-10-CM | POA: Diagnosis not present

## 2020-09-29 DIAGNOSIS — D171 Benign lipomatous neoplasm of skin and subcutaneous tissue of trunk: Secondary | ICD-10-CM | POA: Diagnosis not present

## 2020-09-29 DIAGNOSIS — Z483 Aftercare following surgery for neoplasm: Secondary | ICD-10-CM | POA: Diagnosis not present

## 2020-11-03 ENCOUNTER — Other Ambulatory Visit (HOSPITAL_BASED_OUTPATIENT_CLINIC_OR_DEPARTMENT_OTHER): Payer: Self-pay | Admitting: Internal Medicine

## 2020-11-03 DIAGNOSIS — E785 Hyperlipidemia, unspecified: Secondary | ICD-10-CM | POA: Diagnosis not present

## 2020-11-03 DIAGNOSIS — I10 Essential (primary) hypertension: Secondary | ICD-10-CM | POA: Diagnosis not present

## 2020-11-03 DIAGNOSIS — G245 Blepharospasm: Secondary | ICD-10-CM | POA: Diagnosis not present

## 2020-11-03 MED FILL — AMLODIPINE BESYLATE 5 MG TA: 5 | 30 days supply | Qty: 30 | Fill #0

## 2020-11-04 ENCOUNTER — Other Ambulatory Visit (HOSPITAL_BASED_OUTPATIENT_CLINIC_OR_DEPARTMENT_OTHER): Payer: Self-pay | Admitting: Internal Medicine

## 2020-11-04 MED FILL — METFORMIN HCL 500 MG TABS: 500 | 30 days supply | Qty: 60 | Fill #0

## 2020-12-13 MED FILL — METFORMIN HCL 500 MG TABS: 500 | 30 days supply | Qty: 60 | Fill #1

## 2021-01-30 ENCOUNTER — Other Ambulatory Visit (HOSPITAL_BASED_OUTPATIENT_CLINIC_OR_DEPARTMENT_OTHER): Payer: Self-pay

## 2021-02-01 ENCOUNTER — Other Ambulatory Visit (HOSPITAL_BASED_OUTPATIENT_CLINIC_OR_DEPARTMENT_OTHER): Payer: Self-pay

## 2021-02-09 ENCOUNTER — Other Ambulatory Visit (HOSPITAL_BASED_OUTPATIENT_CLINIC_OR_DEPARTMENT_OTHER): Payer: Self-pay

## 2021-02-09 MED FILL — Metformin HCl Tab 500 MG: ORAL | 30 days supply | Qty: 60 | Fill #0 | Status: AC

## 2021-02-17 ENCOUNTER — Other Ambulatory Visit (HOSPITAL_BASED_OUTPATIENT_CLINIC_OR_DEPARTMENT_OTHER): Payer: Self-pay

## 2021-02-22 ENCOUNTER — Other Ambulatory Visit (HOSPITAL_BASED_OUTPATIENT_CLINIC_OR_DEPARTMENT_OTHER): Payer: Self-pay

## 2021-02-22 MED FILL — Omega-3-acid Ethyl Esters Cap 1 GM: ORAL | 90 days supply | Qty: 180 | Fill #0 | Status: CN

## 2021-02-23 ENCOUNTER — Other Ambulatory Visit (HOSPITAL_BASED_OUTPATIENT_CLINIC_OR_DEPARTMENT_OTHER): Payer: Self-pay

## 2021-02-23 DIAGNOSIS — U071 COVID-19: Secondary | ICD-10-CM | POA: Insufficient documentation

## 2021-02-23 MED FILL — Omega-3-acid Ethyl Esters Cap 1 GM: ORAL | 90 days supply | Qty: 180 | Fill #0 | Status: CN

## 2021-03-03 ENCOUNTER — Other Ambulatory Visit (HOSPITAL_BASED_OUTPATIENT_CLINIC_OR_DEPARTMENT_OTHER): Payer: Self-pay

## 2021-03-03 MED FILL — Omega-3-acid Ethyl Esters Cap 1 GM: ORAL | 90 days supply | Qty: 180 | Fill #0 | Status: CN

## 2021-03-04 ENCOUNTER — Other Ambulatory Visit (HOSPITAL_COMMUNITY): Payer: Self-pay

## 2021-03-04 ENCOUNTER — Other Ambulatory Visit (HOSPITAL_BASED_OUTPATIENT_CLINIC_OR_DEPARTMENT_OTHER): Payer: Self-pay

## 2021-03-06 ENCOUNTER — Other Ambulatory Visit (HOSPITAL_BASED_OUTPATIENT_CLINIC_OR_DEPARTMENT_OTHER): Payer: Self-pay

## 2021-03-07 ENCOUNTER — Other Ambulatory Visit (HOSPITAL_BASED_OUTPATIENT_CLINIC_OR_DEPARTMENT_OTHER): Payer: Self-pay

## 2021-03-08 ENCOUNTER — Other Ambulatory Visit (HOSPITAL_BASED_OUTPATIENT_CLINIC_OR_DEPARTMENT_OTHER): Payer: Self-pay

## 2021-03-09 ENCOUNTER — Other Ambulatory Visit (HOSPITAL_BASED_OUTPATIENT_CLINIC_OR_DEPARTMENT_OTHER): Payer: Self-pay

## 2021-03-14 ENCOUNTER — Other Ambulatory Visit (HOSPITAL_BASED_OUTPATIENT_CLINIC_OR_DEPARTMENT_OTHER): Payer: Self-pay

## 2021-03-14 MED FILL — Omega-3-acid Ethyl Esters Cap 1 GM: ORAL | 90 days supply | Qty: 180 | Fill #0 | Status: CN

## 2021-03-15 ENCOUNTER — Other Ambulatory Visit (HOSPITAL_BASED_OUTPATIENT_CLINIC_OR_DEPARTMENT_OTHER): Payer: Self-pay

## 2021-03-15 MED ORDER — FENOFIBRATE 48 MG PO TABS
ORAL_TABLET | ORAL | 5 refills | Status: DC
  Filled 2021-03-15: qty 30, 30d supply, fill #0
  Filled 2021-04-25: qty 30, 30d supply, fill #1
  Filled 2021-10-04: qty 30, 30d supply, fill #2

## 2021-04-25 ENCOUNTER — Other Ambulatory Visit (HOSPITAL_BASED_OUTPATIENT_CLINIC_OR_DEPARTMENT_OTHER): Payer: Self-pay

## 2021-04-25 MED FILL — Metformin HCl Tab 500 MG: ORAL | 30 days supply | Qty: 60 | Fill #1 | Status: AC

## 2021-04-25 MED FILL — Amlodipine Besylate Tab 5 MG (Base Equivalent): ORAL | 90 days supply | Qty: 90 | Fill #0 | Status: AC

## 2021-06-22 ENCOUNTER — Other Ambulatory Visit (HOSPITAL_BASED_OUTPATIENT_CLINIC_OR_DEPARTMENT_OTHER): Payer: Self-pay

## 2021-06-22 MED ORDER — TRIAMCINOLONE ACETONIDE 0.1 % MT PSTE
PASTE | Freq: Four times a day (QID) | OROMUCOSAL | 1 refills | Status: DC
  Filled 2021-06-22: qty 5, 15d supply, fill #0

## 2021-10-04 ENCOUNTER — Other Ambulatory Visit (HOSPITAL_BASED_OUTPATIENT_CLINIC_OR_DEPARTMENT_OTHER): Payer: Self-pay

## 2021-10-04 MED FILL — Amlodipine Besylate Tab 5 MG (Base Equivalent): ORAL | 60 days supply | Qty: 60 | Fill #1 | Status: AC

## 2021-12-08 ENCOUNTER — Inpatient Hospital Stay: Admission: AD | Admit: 2021-12-08 | Payer: 59 | Source: Other Acute Inpatient Hospital | Admitting: Pulmonary Disease

## 2021-12-08 DIAGNOSIS — R0902 Hypoxemia: Secondary | ICD-10-CM | POA: Diagnosis not present

## 2021-12-08 DIAGNOSIS — Z20822 Contact with and (suspected) exposure to covid-19: Secondary | ICD-10-CM | POA: Diagnosis not present

## 2021-12-08 DIAGNOSIS — I517 Cardiomegaly: Secondary | ICD-10-CM | POA: Diagnosis not present

## 2021-12-08 DIAGNOSIS — R4182 Altered mental status, unspecified: Secondary | ICD-10-CM | POA: Diagnosis not present

## 2021-12-08 DIAGNOSIS — R231 Pallor: Secondary | ICD-10-CM | POA: Diagnosis not present

## 2021-12-08 DIAGNOSIS — I959 Hypotension, unspecified: Secondary | ICD-10-CM | POA: Diagnosis not present

## 2021-12-08 DIAGNOSIS — R109 Unspecified abdominal pain: Secondary | ICD-10-CM | POA: Diagnosis not present

## 2021-12-08 DIAGNOSIS — D735 Infarction of spleen: Secondary | ICD-10-CM | POA: Diagnosis not present

## 2021-12-08 DIAGNOSIS — R519 Headache, unspecified: Secondary | ICD-10-CM | POA: Diagnosis not present

## 2021-12-08 DIAGNOSIS — E1110 Type 2 diabetes mellitus with ketoacidosis without coma: Secondary | ICD-10-CM | POA: Diagnosis not present

## 2021-12-08 DIAGNOSIS — R000 Tachycardia, unspecified: Secondary | ICD-10-CM | POA: Diagnosis not present

## 2021-12-08 DIAGNOSIS — Z0389 Encounter for observation for other suspected diseases and conditions ruled out: Secondary | ICD-10-CM | POA: Diagnosis not present

## 2021-12-08 DIAGNOSIS — R739 Hyperglycemia, unspecified: Secondary | ICD-10-CM | POA: Diagnosis not present

## 2021-12-08 DIAGNOSIS — I81 Portal vein thrombosis: Secondary | ICD-10-CM | POA: Diagnosis not present

## 2021-12-08 DIAGNOSIS — R0689 Other abnormalities of breathing: Secondary | ICD-10-CM | POA: Diagnosis not present

## 2021-12-08 DIAGNOSIS — K7689 Other specified diseases of liver: Secondary | ICD-10-CM | POA: Diagnosis not present

## 2021-12-09 DIAGNOSIS — E1110 Type 2 diabetes mellitus with ketoacidosis without coma: Secondary | ICD-10-CM | POA: Diagnosis not present

## 2021-12-09 DIAGNOSIS — E871 Hypo-osmolality and hyponatremia: Secondary | ICD-10-CM | POA: Diagnosis not present

## 2021-12-09 DIAGNOSIS — R809 Proteinuria, unspecified: Secondary | ICD-10-CM | POA: Diagnosis not present

## 2021-12-09 DIAGNOSIS — E119 Type 2 diabetes mellitus without complications: Secondary | ICD-10-CM | POA: Insufficient documentation

## 2021-12-09 DIAGNOSIS — R0682 Tachypnea, not elsewhere classified: Secondary | ICD-10-CM | POA: Diagnosis not present

## 2021-12-09 DIAGNOSIS — K858 Other acute pancreatitis without necrosis or infection: Secondary | ICD-10-CM | POA: Diagnosis not present

## 2021-12-09 DIAGNOSIS — E870 Hyperosmolality and hypernatremia: Secondary | ICD-10-CM | POA: Diagnosis not present

## 2021-12-09 DIAGNOSIS — D72829 Elevated white blood cell count, unspecified: Secondary | ICD-10-CM | POA: Diagnosis not present

## 2021-12-09 DIAGNOSIS — G5139 Clonic hemifacial spasm, unspecified: Secondary | ICD-10-CM | POA: Diagnosis not present

## 2021-12-09 DIAGNOSIS — Z9981 Dependence on supplemental oxygen: Secondary | ICD-10-CM | POA: Diagnosis not present

## 2021-12-09 DIAGNOSIS — I517 Cardiomegaly: Secondary | ICD-10-CM | POA: Diagnosis not present

## 2021-12-09 DIAGNOSIS — E785 Hyperlipidemia, unspecified: Secondary | ICD-10-CM | POA: Diagnosis not present

## 2021-12-09 DIAGNOSIS — N179 Acute kidney failure, unspecified: Secondary | ICD-10-CM | POA: Insufficient documentation

## 2021-12-09 DIAGNOSIS — E781 Pure hyperglyceridemia: Secondary | ICD-10-CM | POA: Diagnosis not present

## 2021-12-09 DIAGNOSIS — J9601 Acute respiratory failure with hypoxia: Secondary | ICD-10-CM | POA: Diagnosis not present

## 2021-12-09 DIAGNOSIS — N170 Acute kidney failure with tubular necrosis: Secondary | ICD-10-CM | POA: Diagnosis not present

## 2021-12-09 DIAGNOSIS — G9340 Encephalopathy, unspecified: Secondary | ICD-10-CM | POA: Diagnosis not present

## 2021-12-09 DIAGNOSIS — R4182 Altered mental status, unspecified: Secondary | ICD-10-CM | POA: Diagnosis not present

## 2021-12-09 DIAGNOSIS — G928 Other toxic encephalopathy: Secondary | ICD-10-CM | POA: Diagnosis not present

## 2021-12-09 DIAGNOSIS — E1010 Type 1 diabetes mellitus with ketoacidosis without coma: Secondary | ICD-10-CM | POA: Diagnosis not present

## 2021-12-09 DIAGNOSIS — K859 Acute pancreatitis without necrosis or infection, unspecified: Secondary | ICD-10-CM | POA: Diagnosis not present

## 2021-12-09 DIAGNOSIS — R000 Tachycardia, unspecified: Secondary | ICD-10-CM | POA: Diagnosis not present

## 2021-12-09 DIAGNOSIS — E1121 Type 2 diabetes mellitus with diabetic nephropathy: Secondary | ICD-10-CM | POA: Diagnosis not present

## 2021-12-09 DIAGNOSIS — E869 Volume depletion, unspecified: Secondary | ICD-10-CM | POA: Diagnosis not present

## 2021-12-09 DIAGNOSIS — R197 Diarrhea, unspecified: Secondary | ICD-10-CM | POA: Diagnosis not present

## 2021-12-09 DIAGNOSIS — R7989 Other specified abnormal findings of blood chemistry: Secondary | ICD-10-CM | POA: Diagnosis not present

## 2021-12-09 DIAGNOSIS — I252 Old myocardial infarction: Secondary | ICD-10-CM | POA: Diagnosis not present

## 2021-12-09 DIAGNOSIS — Z20822 Contact with and (suspected) exposure to covid-19: Secondary | ICD-10-CM | POA: Diagnosis not present

## 2021-12-09 DIAGNOSIS — Z794 Long term (current) use of insulin: Secondary | ICD-10-CM | POA: Diagnosis not present

## 2021-12-09 DIAGNOSIS — E1165 Type 2 diabetes mellitus with hyperglycemia: Secondary | ICD-10-CM | POA: Diagnosis not present

## 2021-12-09 DIAGNOSIS — Z7901 Long term (current) use of anticoagulants: Secondary | ICD-10-CM | POA: Diagnosis not present

## 2021-12-09 DIAGNOSIS — E876 Hypokalemia: Secondary | ICD-10-CM | POA: Diagnosis not present

## 2021-12-09 DIAGNOSIS — E7800 Pure hypercholesterolemia, unspecified: Secondary | ICD-10-CM | POA: Diagnosis not present

## 2021-12-09 DIAGNOSIS — I10 Essential (primary) hypertension: Secondary | ICD-10-CM | POA: Diagnosis not present

## 2021-12-12 ENCOUNTER — Other Ambulatory Visit (HOSPITAL_BASED_OUTPATIENT_CLINIC_OR_DEPARTMENT_OTHER): Payer: Self-pay

## 2021-12-14 NOTE — Patient Outreach (Addendum)
Received a hospital notification for Mr. Pozzi from Insurance. ?I have assigned Deloria Lair, NP to call for follow up and determine if there are any Case Management needs.  ?  ?Arville Care, CBCS, CMAA ?[place] Management Assistant ?[facility] ?[phone]   ?

## 2021-12-18 ENCOUNTER — Telehealth: Payer: Self-pay | Admitting: Internal Medicine

## 2021-12-18 ENCOUNTER — Other Ambulatory Visit (HOSPITAL_BASED_OUTPATIENT_CLINIC_OR_DEPARTMENT_OTHER): Payer: Self-pay

## 2021-12-18 MED ORDER — INSULIN LISPRO (1 UNIT DIAL) 100 UNIT/ML (KWIKPEN)
2.0000 [IU] | PEN_INJECTOR | Freq: Three times a day (TID) | SUBCUTANEOUS | 0 refills | Status: DC
Start: 2021-12-17 — End: 2022-02-19
  Filled 2022-01-09: qty 6, 17d supply, fill #0

## 2021-12-18 NOTE — Telephone Encounter (Signed)
Scheduled for new patient visit on 01/16/22 with Dr. Debara Pickett  ?

## 2021-12-18 NOTE — Telephone Encounter (Signed)
Patient wife calling to request patient be worked in with Dr. Debara Pickett for the [facility]. She is an employee of the office at [address] and states Dr. Sallyanne Kuster also said he would see the patient. Patient is currently scheduled in September as that is the first available with the [facility]. She says he is currently only on flecainide and was seen in the hospital. She is very concerned and would like to know if he can be worked in for an appointment as soon as possible. Phone: [phone] ?

## 2021-12-19 ENCOUNTER — Other Ambulatory Visit: Payer: Self-pay | Admitting: *Deleted

## 2021-12-19 ENCOUNTER — Other Ambulatory Visit (HOSPITAL_BASED_OUTPATIENT_CLINIC_OR_DEPARTMENT_OTHER): Payer: Self-pay

## 2021-12-19 MED ORDER — INSULIN GLARGINE-YFGN 100 UNIT/ML ~~LOC~~ SOPN
24.0000 [IU] | PEN_INJECTOR | Freq: Every day | SUBCUTANEOUS | 0 refills | Status: DC
  Filled 2022-01-09: qty 7.2, 30d supply, fill #0

## 2021-12-20 ENCOUNTER — Other Ambulatory Visit: Payer: Self-pay | Admitting: *Deleted

## 2021-12-26 DIAGNOSIS — E781 Pure hyperglyceridemia: Secondary | ICD-10-CM | POA: Diagnosis not present

## 2021-12-26 DIAGNOSIS — E870 Hyperosmolality and hypernatremia: Secondary | ICD-10-CM | POA: Diagnosis not present

## 2021-12-26 DIAGNOSIS — I129 Hypertensive chronic kidney disease with stage 1 through stage 4 chronic kidney disease, or unspecified chronic kidney disease: Secondary | ICD-10-CM | POA: Diagnosis not present

## 2021-12-26 DIAGNOSIS — N179 Acute kidney failure, unspecified: Secondary | ICD-10-CM | POA: Diagnosis not present

## 2021-12-26 DIAGNOSIS — E1122 Type 2 diabetes mellitus with diabetic chronic kidney disease: Secondary | ICD-10-CM | POA: Diagnosis not present

## 2021-12-26 DIAGNOSIS — R809 Proteinuria, unspecified: Secondary | ICD-10-CM | POA: Diagnosis not present

## 2021-12-27 ENCOUNTER — Other Ambulatory Visit: Payer: Self-pay | Admitting: Nephrology

## 2021-12-27 DIAGNOSIS — N179 Acute kidney failure, unspecified: Secondary | ICD-10-CM

## 2021-12-29 ENCOUNTER — Other Ambulatory Visit (HOSPITAL_BASED_OUTPATIENT_CLINIC_OR_DEPARTMENT_OTHER): Payer: Self-pay

## 2021-12-29 ENCOUNTER — Ambulatory Visit
Admission: RE | Admit: 2021-12-29 | Discharge: 2021-12-29 | Disposition: A | Discharge status: Home / Self Care | Payer: 59 | Source: Ambulatory Visit | Attending: Nephrology | Admitting: Nephrology

## 2021-12-29 DIAGNOSIS — N179 Acute kidney failure, unspecified: Secondary | ICD-10-CM | POA: Diagnosis not present

## 2021-12-29 DIAGNOSIS — N400 Enlarged prostate without lower urinary tract symptoms: Secondary | ICD-10-CM | POA: Diagnosis not present

## 2021-12-29 MED ORDER — LISINOPRIL 5 MG PO TABS
5.0000 mg | ORAL_TABLET | Freq: Every day | ORAL | 3 refills | Status: DC
  Filled 2021-12-29: qty 90, 90d supply, fill #0
  Filled 2022-03-23: qty 90, 90d supply, fill #1
  Filled 2022-05-08 – 2022-07-04 (×2): qty 90, 90d supply, fill #2
  Filled 2022-10-02: qty 90, 90d supply, fill #3

## 2022-01-09 ENCOUNTER — Encounter: Payer: Self-pay | Admitting: Cardiovascular Disease

## 2022-01-09 ENCOUNTER — Ambulatory Visit (INDEPENDENT_AMBULATORY_CARE_PROVIDER_SITE_OTHER): Payer: 59 | Admitting: Cardiovascular Disease

## 2022-01-09 ENCOUNTER — Other Ambulatory Visit (HOSPITAL_BASED_OUTPATIENT_CLINIC_OR_DEPARTMENT_OTHER): Payer: Self-pay

## 2022-01-09 VITALS — BP 116/80 | HR 84 | Ht 72.0 in | Wt 207.2 lb

## 2022-01-09 DIAGNOSIS — E782 Mixed hyperlipidemia: Secondary | ICD-10-CM

## 2022-01-09 DIAGNOSIS — N179 Acute kidney failure, unspecified: Secondary | ICD-10-CM | POA: Diagnosis not present

## 2022-01-09 DIAGNOSIS — Z9189 Other specified personal risk factors, not elsewhere classified: Secondary | ICD-10-CM

## 2022-01-09 DIAGNOSIS — E785 Hyperlipidemia, unspecified: Secondary | ICD-10-CM

## 2022-01-09 DIAGNOSIS — I10 Essential (primary) hypertension: Secondary | ICD-10-CM

## 2022-01-09 DIAGNOSIS — E1110 Type 2 diabetes mellitus with ketoacidosis without coma: Secondary | ICD-10-CM

## 2022-01-09 DIAGNOSIS — I2510 Atherosclerotic heart disease of native coronary artery without angina pectoris: Secondary | ICD-10-CM

## 2022-01-09 MED ORDER — ICOSAPENT ETHYL 1 G PO CAPS
2.0000 g | ORAL_CAPSULE | Freq: Two times a day (BID) | ORAL | 11 refills | Status: DC
  Filled 2022-01-09: qty 120, 30d supply, fill #0
  Filled 2022-02-14: qty 120, 30d supply, fill #1
  Filled 2022-03-12: qty 120, 30d supply, fill #2
  Filled 2022-04-09: qty 120, 30d supply, fill #3
  Filled 2022-05-08 – 2022-05-14 (×2): qty 120, 30d supply, fill #4
  Filled 2022-06-22 – 2022-07-09 (×3): qty 120, 30d supply, fill #5
  Filled 2022-08-06: qty 120, 30d supply, fill #6
  Filled 2022-09-04: qty 120, 30d supply, fill #7
  Filled 2022-10-02: qty 120, 30d supply, fill #8
  Filled 2022-11-03: qty 120, 30d supply, fill #9
  Filled 2022-12-02: qty 120, 30d supply, fill #10
  Filled 2023-01-07: qty 120, 30d supply, fill #11

## 2022-01-09 MED ORDER — FENOFIBRATE 145 MG PO TABS
145.0000 mg | ORAL_TABLET | Freq: Every day | ORAL | 11 refills | Status: DC
  Filled 2022-01-09: qty 30, 30d supply, fill #0
  Filled 2022-02-09: qty 30, 30d supply, fill #1
  Filled 2022-03-10: qty 30, 30d supply, fill #2
  Filled 2022-04-09: qty 30, 30d supply, fill #3
  Filled 2022-05-06: qty 30, 30d supply, fill #4

## 2022-01-09 NOTE — Progress Notes (Signed)
?Cardiology Office Note:   ? ?Date:  01/11/2022  ? ?ID:  Charles [name], DOB June 28, 1971, MRN [id number] ? ?PCP:  Charles Bogus., MD ?  ?[facility] Providers ?Cardiologist:  Charles Klein, MD    ? ?Referring MD: Charles Bogus., MD  ? ?Chief Complaint  ?Patient presents with  ? Hyperlipidemia  ?Recent acute pancreatitis ? ?History of Present Illness:   ? ?Charles [name] is a 51 y.o. male with a hx of longstanding mixed hyperlipidemia and hypertension, recently hospitalized with acute pancreatitis in the setting of severe hypertriglyceridemia.  He had diabetic ketoacidosis and was diagnosed with diabetes mellitus during the same hospitalization (although hemoglobin A1c elevation suggest this is a more chronic problem).  He also has a family history of early onset coronary artery disease (father with myocardial infarction and bypass surgery at age 15). ? ?He is here to establish cardiology care.  He is also scheduled to see Dr. Debara [name] in our lipid clinic on May 2.  Charles [name]'s wife Charles [name] is a nurse in our cardiac rhythm device clinic. ? ?Charles [name] does not have an endocrinologist yet, but is scheduled to establish primary care follow-up with Dr. Osborne [name] at [facility] (I suspect he may be referred to Dr. Forde [name] at that point).  He is also seeing Dr. Carolin [name] at [facility], for recent problems with acute kidney injury during the episode of pancreatitis. ? ?He was hospitalized with acute abdominal pain on 12/08/2021 at [facility].  He was diagnosed with acute pancreatitis.  Lipase was 454, CT showed typical findings of acute pancreatitis, also showed diffuse hepatic steatosis.  There was no evidence of gallstones or pancreatic abscess or pseudocyst.  ? ?He had severe metabolic abnormalities.  Peak glucose was 926 and sodium was 164, creatinine 3.65.  He was acidotic with pH 7.045 anion gap of 33 and serum bicarb of 5, beta hydroxybutyrate was greater than 7.5, consistent with diabetic  ketoacidosis and severe volume contraction.  On arrival, hemoglobin A1c was 14.6% suggesting at least a few months of diabetes mellitus.  Direct LDL was only 62, but HDL was only 12, total cholesterol was 680 and triglycerides were 2496 (repeat assay 2611).  By the time of discharge his triglycerides had decreased to 775 569 2138 range.  Plasmapheresis was considered but deemed unnecessary. ? ?Labs from his primary care physician and recent hospitalization from:  ?2019  triglycerides 375, total cholesterol 189, HDL 37, LDL 77 and fasting glucose 109 ?2020  triglycerides 284, total cholesterol 205, HDL 36, LDL 112, fasting glucose 113 ?11/03/2020 triglycerides 2364, total cholesterol 300, HDL 11, fasting glucose 233.  Same lab assay his sodium was low at 121.  He was advised to go to the emergency department but he was asymptomatic and declined. ?12/08/2021 triglycerides 2496, total cholesterol 680, HDL 12, direct LDL 62 ? ?Charles [name] has been aware of hypertriglyceridemia for about 30 years, since he was a young adult.  He has been prescribed statins repeatedly, but these have consistently led to abnormalities in his liver function tests and have been stopped, followed by normalization of the liver test abnormalities.  At the time of his discharge from [facility] he was prescribed fenofibrate 48 mg once daily which she is taking, as well as Lovaza, but this latter medication was not approved by insurance.   ? ?Despite the fact that he had an elevated glucose and severely elevated triglycerides as far back as February 2022, it does not appear that Charles [name] was  aware of the diagnosis of diabetes mellitus until his admission with DKA last month.  He ? ?He drinks alcohol very infrequently, a handful of times a year. ? ?He does not have a history of CAD or PAD.  I do not think he is ever had a formal evaluation for such either.  During his hospitalization with acute pancreatitis an abdominal CT was performed that did not mention any  evidence of atherosclerosis in the aorta or the visceral branches. ? ?Additional medical problems include essential hypertension which has generally been well treated and clonic hemifacial spasm (left blepharospasm) which has been frustratingly difficult to treat.  Cardiac medications have been stopped for periods of time to see if this will lead to improvement in blepharospasm, without significant change. ? ?He currently feels well and has recovered from the episode of acute pancreatitis.  He denies abdominal pain.  He does not have chest pain at rest or with activity.  He denies dyspnea at rest or with activity.  He has not had dizziness palpitations or syncope.  He does not have intermittent claudication and other than the left-sided blepharospasm has not had any focal neurological problems.  He denies leg edema.   ? ?Charles [name]'s father had his first myocardial infarction at age 62 and had bypass surgery shortly thereafter.  He did smoke.  He died at age 81.  Charles [name] mother has diabetes mellitus and had a stroke in her late 74s. ? ?Past Medical History:  ?Diagnosis Date  ? Diabetes ([place])   ? Hyperlipidemia   ? Hypertension   ? ? ?Past Surgical History:  ?Procedure Laterality Date  ? MASS EXCISION  06/17/2012  ? Procedure: MINOR EXCISION OF MASS;  Surgeon: Haywood Lasso, MD;  Location: [facility];  Service: General;  Laterality: N/A;  Removal Cyst of Right Upper Back  ? ? ?Current Medications: ?Current Meds  ?Medication Sig  ? amLODipine (NORVASC) 5 MG tablet TAKE ONE TABLET (5 MG DOSE) BY MOUTH DAILY.  ? Blood Glucose Calibration (MYGLUCOHEALTH CONTROL) SOLN 1 application. by Misc.(Non-Drug; Combo Route) route once a week. Use to check that meter is working properly once weekly.  ? fenofibrate (TRICOR) 145 MG tablet Take 1 tablet (145 mg total) by mouth daily.  ? icosapent Ethyl (VASCEPA) 1 g capsule Take 2 capsules (2 g total) by mouth 2 (two) times daily.  ? insulin glargine-yfgn (SEMGLEE) 100  UNIT/ML Pen Inject 24 Units into the skin daily for 30 days.  ? insulin lispro (HUMALOG KWIKPEN) 100 UNIT/ML KwikPen Inject 2-12 Units into the skin 3 (three) times daily with meals per scale. BG = 180-200: 2 units, BG = 201-250: 4 units, BG = 251-300: 6 units, BG = 301-350: 8 units, BG = 351-400: 10 units, and BG = 401-450: 12 units.  ? lisinopril (ZESTRIL) 5 MG tablet Take 1 tablet by mouth daily  ? [DISCONTINUED] fenofibrate (TRICOR) 48 MG tablet Take one tablet (48 mg dose) by mouth daily.  ?  ? ?Allergies:   Patient has no known allergies.  ? ?Social History  ? ?Socioeconomic History  ? Marital status: Married  ?  Spouse name: Not on file  ? Number of children: Not on file  ? Years of education: Not on file  ? Highest education level: Not on file  ?Occupational History  ? Not on file  ?Tobacco Use  ? Smoking status: Never  ? Smokeless tobacco: Never  ?Vaping Use  ? Vaping Use: Never used  ?Substance and Sexual  Activity  ? Alcohol use: Yes  ? Drug use: No  ? Sexual activity: Not on file  ?Other Topics Concern  ? Not on file  ?Social History Narrative  ? Not on file  ? ?Social Determinants of Health  ? ?Financial Resource Strain: Not on file  ?Food Insecurity: Not on file  ?Transportation Needs: Not on file  ?Physical Activity: Not on file  ?Stress: Not on file  ?Social Connections: Not on file  ?  ? ?Family History: ?The patient's family history includes Cancer in his mother; Heart disease in his father; Lymphoma in his mother. ? ?ROS:   ?Please see the history of present illness.    ? All other systems reviewed and are negative. ? ?EKGs/Labs/Other Studies Reviewed:   ? ?The following studies were reviewed today: ?Extensive review of notes, labs, imaging studies in care everywhere from his recent hospitalization and previous visits with his primary care provider over the last few years. ? ?EKG:  EKG is ordered today.  The ekg ordered today demonstrates normal sinus rhythm, questionable voltage criteria for LVH  (tall R waves in lead aVL and lead I, but otherwise normal voltage and no repolarization abnormalities), no evidence of ischemic changes, QTc 456 ms ? ?Recent Labs: ?No results found for requested labs within l

## 2022-01-09 NOTE — Patient Instructions (Signed)
Medication Instructions:  ?INCREASE the Fenofibrate to 145 mg once daily ? ?START Vascepa 2 mg twice daily ? ?*If you need a refill on your cardiac medications before your next appointment, please call your pharmacy* ? ? ?Lab Work: ?Your provider would like for you to return in 6 weeks to have the following labs drawn: Fasting NMR. You do not need an appointment for the lab. Once in our office lobby there is a podium where you can sign in and ring the doorbell to alert [place] that you are here. The lab is open from 8:00 am to 4 pm; closed for lunch from 12:45pm-1:45pm. ? ?You may also go to any of these [facility] locations: ?  ?[place] ?- [place] ([facility]) ?- [address] ?- [place] ?If you have labs (blood work) drawn today and your tests are completely normal, you will receive your results only by: ?MyChart Message (if you have MyChart) OR ?A paper copy in the mail ?If you have any lab test that is abnormal or we need to change your treatment, we will call you to review the results. ? ? ?Testing/Procedures: ?Your physician has requested that you have an exercise tolerance test. For further information please visit [email or web address]. Please also follow instruction sheet, as given. This will take place at [place], [address]. ?Do not drink or eat foods with caffeine for 24 hours before the test. (Chocolate, coffee, tea, or energy drinks) ?If you use an inhaler, bring it with you to the test. ?Do not smoke for 4 hours before the test. ?Wear comfortable shoes and clothing. ? ? ? ?Follow-Up: ?At [facility], you and your health needs are our priority.  As part of our continuing mission to provide you with exceptional heart care, we have created designated Provider Care Teams.  These Care Teams include your primary Cardiologist (physician) and Advanced Practice Providers (APPs -  Physician Assistants and Nurse Practitioners) who all  work together to provide you with the care you need, when you need it. ? ?We recommend signing up for the patient portal called "MyChart".  Sign up information is provided on this After Visit Summary.  MyChart is used to connect with patients for Virtual Visits (Telemedicine).  Patients are able to view lab/test results, encounter notes, upcoming appointments, etc.  Non-urgent messages can be sent to your provider as well.   ?To learn more about what you can do with MyChart, go to [email or web address].   ? ?Your next appointment:   ?3 month(s) ? ?The format for your next appointment:   ?In Person ? ?Provider:   ?Sanda Klein, MD { ? ? ? ?Important Information About Sugar ? ? ? ? ? ? ?

## 2022-01-10 ENCOUNTER — Other Ambulatory Visit (HOSPITAL_BASED_OUTPATIENT_CLINIC_OR_DEPARTMENT_OTHER): Payer: Self-pay

## 2022-01-11 ENCOUNTER — Other Ambulatory Visit: Payer: Self-pay | Admitting: *Deleted

## 2022-01-11 ENCOUNTER — Other Ambulatory Visit: Payer: Self-pay

## 2022-01-11 ENCOUNTER — Encounter: Payer: Self-pay | Admitting: *Deleted

## 2022-01-11 ENCOUNTER — Emergency Department
Admission: EM | Admit: 2022-01-11 | Discharge: 2022-01-11 | Disposition: A | Discharge status: Home / Self Care | Payer: 59 | Source: Home / Self Care

## 2022-01-11 ENCOUNTER — Other Ambulatory Visit (HOSPITAL_BASED_OUTPATIENT_CLINIC_OR_DEPARTMENT_OTHER): Payer: Self-pay

## 2022-01-11 ENCOUNTER — Encounter: Payer: Self-pay | Admitting: Emergency Medicine

## 2022-01-11 DIAGNOSIS — Z760 Encounter for issue of repeat prescription: Secondary | ICD-10-CM | POA: Diagnosis not present

## 2022-01-11 DIAGNOSIS — G4700 Insomnia, unspecified: Secondary | ICD-10-CM | POA: Insufficient documentation

## 2022-01-11 DIAGNOSIS — G245 Blepharospasm: Secondary | ICD-10-CM | POA: Insufficient documentation

## 2022-01-11 HISTORY — DX: Type 2 diabetes mellitus without complications: E11.9

## 2022-01-11 MED ORDER — INSULIN GLARGINE-YFGN 100 UNIT/ML ~~LOC~~ SOPN
PEN_INJECTOR | SUBCUTANEOUS | 0 refills | Status: DC
Start: 2022-01-11 — End: 2022-01-16
  Filled 2022-01-11: qty 3, 13d supply, fill #0

## 2022-01-11 NOTE — Patient Outreach (Signed)
Charles [name]) Care Management ?Geriatric Nurse Practitioner Note ? ? ?01/12/2022 ?Name:  Charles [name] MRN:  [id number] DOB:  1971/01/21 ? ?Summary: ?Pt had urgent care visit because he was unable to obtain his newly prescribed insulin from his previous or pending new primary care provider. He currently has all his meds and will see Dr. Osborne Casco next week. ? ?Recommendations/Changes made from today's visit: ?Attend all visits, call when problems are recognized, make sure to call for meds 3-4 days before running out. ? ?Subjective: ?Charles [name] is an 51 y.o. year old male who is a primary patient of Tisovec, Fransico Him, MD. The care management team was consulted for assistance with care management and/or care coordination needs.   ? ?Geriatric Nurse Practitioner completed Telephone Visit today.  ? ?Patient Active Problem List  ? Diagnosis Date Noted  ? Blepharospasm 01/11/2022  ? Insomnia 01/11/2022  ? AKI (acute kidney injury) ([place]) 12/09/2021  ? DM (diabetes mellitus) ([place]) 12/09/2021  ? COVID-19 02/23/2021  ? Benign essential hypertension 06/24/2019  ? 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 Hyperlipidemia LDL goal <130 06/24/2019  ? Sebaceous cyst 12/02/2013  ? ?Outpatient Encounter Medications as of 01/11/2022  ?Medication Sig Note  ? amLODipine (NORVASC) 5 MG tablet TAKE ONE TABLET (5 MG DOSE) BY MOUTH DAILY. 12/20/2021: Taking.  ? Blood Glucose Calibration (MYGLUCOHEALTH CONTROL) SOLN 1 application. by Misc.(Non-Drug; Combo Route) route once a week. Use to check that meter is working properly once weekly.   ? Blood Glucose Monitoring Suppl (ACCU-CHEK GUIDE) w/Device KIT USE METER TO CHECK BLOOD SUGAR THREE TIMES A DAY   ? fenofibrate (TRICOR) 145 MG tablet Take 1 tablet (145 mg total) by mouth daily.   ? icosapent Ethyl (VASCEPA) 1 g capsule Take 2 capsules (2 g total) by mouth 2 (two) times daily.   ? insulin glargine-yfgn (SEMGLEE) 100 UNIT/ML Pen Inject 24 Units into the skin daily for 30  days.   ? insulin glargine-yfgn (SEMGLEE) 100 UNIT/ML Pen Inject 24 units under the skin once daily   ? insulin lispro (HUMALOG KWIKPEN) 100 UNIT/ML KwikPen Inject 2-12 Units into the skin 3 (three) times daily with meals per scale. BG = 180-200: 2 units, BG = 201-250: 4 units, BG = 251-300: 6 units, BG = 301-350: 8 units, BG = 351-400: 10 units, and BG = 401-450: 12 units.   ? lisinopril (ZESTRIL) 5 MG tablet Take 1 tablet by mouth daily   ? ?No facility-administered encounter medications on file as of 01/11/2022.  ? ?SDOH:  (Social Determinants of Health) assessments and interventions performed:  ?SDOH Interventions   ? ?Flowsheet Row Most Recent Value  ?SDOH Interventions   ?Food Insecurity Interventions Intervention Not Indicated  ?Transportation Interventions Intervention Not Indicated  ? ?  ? ?Care Plan ? ?Review of patient past medical history, allergies, medications, health status, including review of consultants reports, laboratory and other test data, was performed as part of comprehensive evaluation for care management services.  ? ?Care Plan : [facility] NP Plan of Care  ?Updates made by Deloria Lair, NP since 01/12/2022 12:00 AM  ?  ? ?Problem: Newly diagnosed DM II   ?Priority: High  ?Onset Date: 01/11/2022  ?  ? ?Long-Range Goal: Patient will check his glucose levels bid and report levels to NP on each call over the next 3 months.   ?Start Date: 01/11/2022  ?Expected End Date: 04/16/2022  ?Priority: High  ?Note:   ?Current Barriers:  ?Chronic Disease Management support  and education needs related to DMII  ? ?RNCM Clinical Goal(s):  ?Patient will verbalize basic understanding of  DMII disease process and self health management plan as evidenced by conversations with pt. ?take all medications exactly as prescribed and will call provider for medication related questions as evidenced by pt report. ?demonstrate understanding of rationale for each prescribed medication as evidenced by pt and NP discussions. ?attend  all scheduled medical appointments: for diabetes education and follow up as evidenced by pt report  through collaboration with RN Care manager, provider, and care team.  ? ?Interventions: ?Inter-disciplinary care team collaboration (see longitudinal plan of care) ?Evaluation of current treatment plan related to  self management and patient's adherence to plan as established by provider ? ? ?Diabetes Interventions:  (Status:  New goal.) Long Term Goal ?Assessed patient's understanding of A1c goal: <7% ?Reviewed medications with patient and discussed importance of medication adherence ?Discussed plans with patient for ongoing care management follow up and provided patient with direct contact information for care management team ?Reviewed scheduled/upcoming provider appointments including: DM/Nutrition classes, new provider and specialists (cardiology & nephrology. ?Advised patient, providing education and rationale, to check cbg bid and record, calling MD for findings outside established parameters ?No results found for: HGBA1C Pt reports His FBS are <120 and NFBS are <160. ? ?Patient Goals/Self-Care Activities: ?Take all medications as prescribed ?Attend all scheduled provider appointments ?Call pharmacy for medication refills 3-7 days in advance of running out of medications ?Call provider office for new concerns or questions  ?schedule appointment with eye doctor ?check blood sugar at prescribed times: twice daily ?check feet daily for cuts, sores or redness ?enter blood sugar readings and medication or insulin into daily log ? ?Follow Up Plan:  Telephone follow up appointment with care management team member scheduled for:  end of May. ? ?  ? ?Goal: Pt will begin diabetes and nutrition classes and attend all scheduled classes over the next 30 days.   ?Start Date: 01/11/2022  ?Expected End Date: 02/14/2022  ?Priority: High  ?Note:   ?Current Barriers:  ?Chronic Disease Management support and education needs related  to DMII  ? ?RNCM Clinical Goal(s):  ?Patient will attend all scheduled medical appointments: DM/Nutrition classes as evidenced by pt report and chart review. ?work with DM specialist to learn DM basics  as evidenced by pt report  through collaboration with Consulting civil engineer, provider, and care team.  ? ?Interventions: ?Inter-disciplinary care team collaboration (see longitudinal plan of care) ?Evaluation of current treatment plan related to  self management and patient's adherence to plan as established by provider ? ?Patient Goals/Self-Care Activities: ?Attend all scheduled provider appointments ? ?Follow Up Plan:  Telephone follow up appointment with care management team member scheduled for:  end of May. ? ?  ? ? ? ?Plan: Telephone follow up appointment with care management team member scheduled for:  02/13/22. Pt agrees with plan of care. ? ?Eulah Pont. Asmi Fugere, MSN, GNP-BC ?Gerontological Nurse Practitioner ?[facility] Care Management ?[phone] ? ?

## 2022-01-11 NOTE — ED Provider Notes (Signed)
?[facility] ? ? ? ?CSN: [id number] ?Arrival date & time: 01/11/22  0908 ? ? ?  ? ?History   ?Chief Complaint ?Chief Complaint  ?Patient presents with  ? Medication Refill  ? ? ?HPI ?Charles [name] is a 51 y.o. male.  ? ?HPI 51 year old male presents for with medication refill. Patient request insulin refill.  Patient reports is scheduled to see his endocrinologist on 01/22/2022. ? ?Past Medical History:  ?Diagnosis Date  ? Diabetes ([place])   ? Hyperlipidemia   ? Hypertension   ? ? ?Patient Active Problem List  ? Diagnosis Date Noted  ? Sebaceous cyst 12/02/2013  ? ? ?Past Surgical History:  ?Procedure Laterality Date  ? MASS EXCISION  06/17/2012  ? Procedure: MINOR EXCISION OF MASS;  Surgeon: Haywood Lasso, MD;  Location: [facility];  Service: General;  Laterality: N/A;  Removal Cyst of Right Upper Back  ? ? ? ? ? ?Home Medications   ? ?Prior to Admission medications   ?Medication Sig Start Date End Date Taking? Authorizing Provider  ?insulin glargine (SEMGLEE) 100 UNIT/ML injection Inject 24 units into skin daily for 12 days 01/11/22  Yes Eliezer Lofts, FNP  ?amLODipine (NORVASC) 5 MG tablet TAKE ONE TABLET (5 MG DOSE) BY MOUTH DAILY. 11/03/20 01/09/22  Arman Bogus., MD  ?Blood Glucose Calibration [facility] CONTROL) SOLN 1 application. by Misc.(Non-Drug; Combo Route) route once a week. Use to check that meter is working properly once weekly. 12/17/21 03/17/22  [provider]  ?Blood Glucose Monitoring Suppl (ACCU-CHEK GUIDE) w/Device KIT USE METER TO CHECK BLOOD SUGAR THREE TIMES A DAY 12/17/21   [provider]  ?fenofibrate (TRICOR) 145 MG tablet Take 1 tablet (145 mg total) by mouth daily. 01/09/22   Croitoru, Mihai, MD  ?icosapent Ethyl (VASCEPA) 1 g capsule Take 2 capsules (2 g total) by mouth 2 (two) times daily. 01/09/22   Croitoru, Mihai, MD  ?insulin glargine-yfgn (SEMGLEE) 100 UNIT/ML Pen Inject 24 Units into the skin daily for 30 days. 12/17/21     ?insulin lispro  (HUMALOG KWIKPEN) 100 UNIT/ML KwikPen Inject 2-12 Units into the skin 3 (three) times daily with meals per scale. BG = 180-200: 2 units, BG = 201-250: 4 units, BG = 251-300: 6 units, BG = 301-350: 8 units, BG = 351-400: 10 units, and BG = 401-450: 12 units. 12/17/21     ?lisinopril (ZESTRIL) 5 MG tablet Take 1 tablet by mouth daily 12/29/21     ? ? ?Family History ?Family History  ?Problem Relation Age of Onset  ? Lymphoma Mother   ? Cancer Mother   ?     lymphoma  ? Heart disease Father   ? ? ?Social History ?Social History  ? ?Tobacco Use  ? Smoking status: Never  ? Smokeless tobacco: Never  ?Vaping Use  ? Vaping Use: Never used  ?Substance Use Topics  ? Alcohol use: Yes  ? Drug use: No  ? ? ? ?Allergies   ?Patient has no known allergies. ? ? ?Review of Systems ?Review of Systems  ?All other systems reviewed and are negative. ? ? ?Physical Exam ?Triage Vital Signs ?ED Triage Vitals  ?Enc Vitals Group  ?   BP 01/11/22 0932 119/80  ?   Pulse Rate 01/11/22 0932 86  ?   Resp --   ?   Temp 01/11/22 0932 98.6 ?F (37 ?C)  ?   Temp Source 01/11/22 0932 Oral  ?   SpO2 01/11/22 0932 99 %  ?  Weight 01/11/22 0934 207 lb (93.9 kg)  ?   Height 01/11/22 0934 6' (1.829 m)  ?   Head Circumference --   ?   Peak Flow --   ?   Pain Score 01/11/22 0934 0  ?   Pain Loc --   ?   Pain Edu? --   ?   Excl. in [place]? --   ? ?No data found. ? ?Updated Vital Signs ?BP 119/80 (BP Location: Left Arm)   Pulse 86   Temp 98.6 ?F (37 ?C) (Oral)   Ht 6' (1.829 m)   Wt 207 lb (93.9 kg)   SpO2 99%   BMI 28.07 kg/m?  ? ?Physical Exam ?Vitals and nursing note reviewed.  ?Constitutional:   ?   Appearance: Normal appearance. He is normal weight.  ?HENT:  ?   Head: Normocephalic and atraumatic.  ?   Mouth/Throat:  ?   Mouth: Mucous membranes are moist.  ?   Pharynx: Oropharynx is clear.  ?Eyes:  ?   Extraocular Movements: Extraocular movements intact.  ?   Conjunctiva/sclera: Conjunctivae normal.  ?   Pupils: Pupils are equal, round, and reactive to  light.  ?Cardiovascular:  ?   Rate and Rhythm: Normal rate and regular rhythm.  ?   Pulses: Normal pulses.  ?   Heart sounds: Normal heart sounds.  ?Pulmonary:  ?   Effort: Pulmonary effort is normal.  ?   Breath sounds: Normal breath sounds. No wheezing, rhonchi or rales.  ?Musculoskeletal:  ?   Cervical back: Normal range of motion and neck supple.  ?Skin: ?   General: Skin is warm and dry.  ?Neurological:  ?   General: No focal deficit present.  ?   Mental Status: He is alert and oriented to person, place, and time. Mental status is at baseline.  ? ? ? ?UC Treatments / Results  ?Labs ?(all labs ordered are listed, but only abnormal results are displayed) ?Labs Reviewed - No data to display ? ?EKG ? ? ?Radiology ?No results found. ? ?Procedures ?Procedures (including critical care time) ? ?Medications Ordered in UC ?Medications - No data to display ? ?Initial Impression / Assessment and Plan / UC Course  ?I have reviewed the triage vital signs and the nursing notes. ? ?Pertinent labs & imaging results that were available during my care of the patient were reviewed by me and considered in my medical decision making (see chart for details). ? ?  ? ?MDM: 1.  Medication Refill-insulin glargine (Semglee) 100 unit/mL. Instructed patient to take medication as directed to completion.  Patient discharged home, hemodynamically stable. ?Final Clinical Impressions(s) / UC Diagnoses  ? ?Final diagnoses:  ?Medication refill  ? ? ? ?Discharge Instructions   ? ?  ?Instructed patient to take medication as directed to completion. ? ? ? ?ED Prescriptions   ? ? Medication Sig Dispense Auth. Provider  ? insulin glargine (SEMGLEE) 100 UNIT/ML injection Inject 24 units into skin daily for 12 days 3 mL Eliezer Lofts, FNP  ? ?  ? ?PDMP not reviewed this encounter. ?  ?Eliezer Lofts, [place] ?01/11/22 7425 ? ?

## 2022-01-11 NOTE — Discharge Instructions (Addendum)
Instructed patient to take medication as directed to completion. ?

## 2022-01-11 NOTE — ED Triage Notes (Addendum)
Needs rx ?Semglee Insulin glarg-yfgn Pen 100 unit.ml 82m injection ?

## 2022-01-16 ENCOUNTER — Telehealth: Payer: Self-pay | Admitting: Internal Medicine

## 2022-01-16 ENCOUNTER — Ambulatory Visit: Payer: 59 | Admitting: Internal Medicine

## 2022-01-16 ENCOUNTER — Telehealth (HOSPITAL_COMMUNITY): Payer: Self-pay | Admitting: *Deleted

## 2022-01-16 ENCOUNTER — Encounter: Payer: Self-pay | Admitting: Internal Medicine

## 2022-01-16 VITALS — BP 119/80 | HR 84 | Ht 72.0 in | Wt 206.0 lb

## 2022-01-16 DIAGNOSIS — E1110 Type 2 diabetes mellitus with ketoacidosis without coma: Secondary | ICD-10-CM | POA: Diagnosis not present

## 2022-01-16 DIAGNOSIS — I10 Essential (primary) hypertension: Secondary | ICD-10-CM

## 2022-01-16 DIAGNOSIS — K858 Other acute pancreatitis without necrosis or infection: Secondary | ICD-10-CM

## 2022-01-16 DIAGNOSIS — E783 Hyperchylomicronemia: Secondary | ICD-10-CM

## 2022-01-16 DIAGNOSIS — E781 Pure hyperglyceridemia: Secondary | ICD-10-CM | POA: Diagnosis not present

## 2022-01-16 NOTE — Patient Instructions (Signed)
Medication Instructions:  ?NO CHANGES ? ?*If you need a refill on your cardiac medications before your next appointment, please call your pharmacy* ? ? ?Lab Work: ?FASTING NMR lipoprofile in about 3-4 months  ?-- complete about 1 week before your next visit with Dr. Debara Pickett  ? ?If you have labs (blood work) drawn today and your tests are completely normal, you will receive your results only by: ?MyChart Message (if you have MyChart) OR ?A paper copy in the mail ?If you have any lab test that is abnormal or we need to change your treatment, we will call you to review the results. ? ? ?Follow-Up: ?At [facility], you and your health needs are our priority.  As part of our continuing mission to provide you with exceptional heart care, we have created designated Provider Care Teams.  These Care Teams include your primary Cardiologist (physician) and Advanced Practice Providers (APPs -  Physician Assistants and Nurse Practitioners) who all work together to provide you with the care you need, when you need it. ? ?We recommend signing up for the patient portal called "MyChart".  Sign up information is provided on this After Visit Summary.  MyChart is used to connect with patients for Virtual Visits (Telemedicine).  Patients are able to view lab/test results, encounter notes, upcoming appointments, etc.  Non-urgent messages can be sent to your provider as well.   ?To learn more about what you can do with MyChart, go to [email or web address].   ? ?Your next appointment:   ?3-4 months with Dr. Debara Pickett  ?-- lipid clinic ?-- [facility] or [facility] ?

## 2022-01-16 NOTE — Telephone Encounter (Signed)
Genetic test for dyslipidemia/ASCVD ordered ([facility]) ?Cheek swab completed in office ?Specimen and necessary paperwork mailed. ?ID: [id number] ? ?Patient aware results should be available via email in 2-3 weeks and Dr. Debara Pickett will reach out to explain results ? ?

## 2022-01-16 NOTE — Progress Notes (Signed)
? ? ?LIPID CLINIC CONSULT NOTE ? ?Chief Complaint:  ?High triglycerides ? ?Primary Care Physician: ?Haywood Pao, MD ? ?Primary Cardiologist:  ?Sanda Klein, MD ? ?HPI:  ?Charles [name] is a 51 y.o. 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 Sallyanne Kuster. This is a pleasant 51 year old male with a history of high triglycerides in the past who recently had acute onset diabetes associated with acute kidney injury and pancreatitis.  Triglycerides were markedly elevated in the thousands.  A lipid profile showed total cholesterol 680, triglycerides 2496, HDL of 12 and LDL 62.  He had previously been on fenofibrate and remotely had been on a statin but that was discontinued by his PCP.  He recently was seen by Dr. Sallyanne Kuster in follow-up and started on Vascepa.  He has not been on that medicine for a week without any issues.  He does report heart disease in the family including his father who had CABG in his 76s and had repeat bypass surgery in his 58s.  He is not certain if he had a history of high triglycerides but I suspect this is the case.  He also has 3 children and would like genetic screening today to try to evaluate their risk.  He reports changes in his diet recently to reduce carbohydrates but is not necessarily focused on lower fat. ? ?PMHx:  ?Past Medical History:  ?Diagnosis Date  ? Diabetes ([place])   ? Hyperlipidemia   ? Hypertension   ? ? ?Past Surgical History:  ?Procedure Laterality Date  ? MASS EXCISION  06/17/2012  ? Procedure: MINOR EXCISION OF MASS;  Surgeon: Haywood Lasso, MD;  Location: [facility];  Service: General;  Laterality: N/A;  Removal Cyst of Right Upper Back  ? ? ?FAMHx:  ?Family History  ?Problem Relation Age of Onset  ? Lymphoma Mother   ? Cancer Mother   ?     lymphoma  ? Heart disease Father   ? ? ?SOCHx:  ? reports that he has never smoked. He has never used smokeless tobacco. He reports current alcohol use. He  reports that he does not use drugs. ? ?ALLERGIES:  ?No Known Allergies ? ?ROS: ?Pertinent items noted in HPI and remainder of comprehensive ROS otherwise negative. ? ?HOME MEDS: ?Current Outpatient Medications on File Prior to Visit  ?Medication Sig Dispense Refill  ? amLODipine (NORVASC) 5 MG tablet TAKE ONE TABLET (5 MG DOSE) BY MOUTH DAILY. 30 tablet 5  ? Blood Glucose Calibration (MYGLUCOHEALTH CONTROL) SOLN 1 application. by Misc.(Non-Drug; Combo Route) route once a week. Use to check that meter is working properly once weekly.    ? Blood Glucose Monitoring Suppl (ACCU-CHEK GUIDE) w/Device KIT USE METER TO CHECK BLOOD SUGAR THREE TIMES A DAY    ? cholecalciferol (VITAMIN D3) 25 MCG (1000 UNIT) tablet Take 2,000 Units by mouth daily.    ? fenofibrate (TRICOR) 145 MG tablet Take 1 tablet (145 mg total) by mouth daily. 30 tablet 11  ? icosapent Ethyl (VASCEPA) 1 g capsule Take 2 capsules (2 g total) by mouth 2 (two) times daily. 120 capsule 11  ? insulin glargine-yfgn (SEMGLEE) 100 UNIT/ML Pen Inject 24 Units into the skin daily for 30 days. 7.2 mL 0  ? insulin lispro (HUMALOG KWIKPEN) 100 UNIT/ML KwikPen Inject 2-12 Units into the skin 3 (three) times daily with meals per scale. BG = 180-200: 2 units, BG = 201-250: 4 units, BG =  251-300: 6 units, BG = 301-350: 8 units, BG = 351-400: 10 units, and BG = 401-450: 12 units. 15 mL 0  ? lisinopril (ZESTRIL) 5 MG tablet Take 1 tablet by mouth daily 90 tablet 3  ? Multiple Vitamins-Minerals (CENTURY PO) Take by mouth.    ? ?No current facility-administered medications on file prior to visit.  ? ? ?LABS/IMAGING: ?No results found for this or any previous visit (from the past 48 hour(s)). ?No results found. ? ?LIPID PANEL: ?No results found for: CHOL, TRIG, HDL, CHOLHDL, VLDL, LDLCALC, LDLDIRECT ? ?WEIGHTS: ?Wt Readings from Last 3 Encounters:  ?01/16/22 206 lb (93.4 kg)  ?01/11/22 207 lb (93.9 kg)  ?01/09/22 207 lb 3.2 oz (94 kg)  ? ? ?VITALS: ?BP 119/80   Pulse 84   Ht  6' (1.829 m)   Wt 206 lb (93.4 kg)   SpO2 98%   BMI 27.94 kg/m?  ? ?EXAM: ?Deferred ? ?EKG: ?Deferred ? 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 ?PLAN: ?1.   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  There is a family history of early onset heart disease in his father suggesting this is likely pathogenic.  This could be dysbetalipoproteinemia.  I agree where genetic testing would be very helpful to further ascertain the diagnosis and and cascade screening of his children.  We will go ahead and send that off to [facility] today.  I did advise him that he may pay up to $299 for the test if its not covered by insurance.  Goal is to try to get his triglycerides below 500 to reduce the risk of recurrent pancreatitis.  I would continue his fenofibrate and Vascepa.  He will likely need to go on a statin as well.  He has a lipid NMR scheduled for 2 to 3 months from now.  I will follow-up on that and make adjustments as necessary.  He is to undergo treadmill stress testing tomorrow.  While this may be helpful to rule out obstructive coronary disease, it may be worthwhile to consider a calcium score to evaluate as to whether or not he has developed any coronary atherosclerosis. ? ?Thanks again for the kind referral. ? ?Pixie Casino, MD, [facility], FACP  ?[place]  ?Medical Director of the Advanced Lipid Disorders &  ?Cardiovascular Risk Reduction Clinic ?Diplomate of the [employer] of Clinical Lipidology ?Attending Cardiologist  ?Direct Dial: [phone]  Fax: [phone]  ?Website:  [email or web address] ? ?Nadean Corwin Tawyna Pellot ?01/16/2022, 10:22 AM ?

## 2022-01-16 NOTE — Telephone Encounter (Signed)
Close encounter 

## 2022-01-17 ENCOUNTER — Ambulatory Visit (HOSPITAL_COMMUNITY)
Admission: RE | Admit: 2022-01-17 | Discharge: 2022-01-17 | Disposition: A | Discharge status: Home / Self Care | Payer: 59 | Source: Ambulatory Visit | Attending: Cardiovascular Disease | Admitting: Cardiovascular Disease

## 2022-01-17 DIAGNOSIS — Z9189 Other specified personal risk factors, not elsewhere classified: Secondary | ICD-10-CM | POA: Diagnosis not present

## 2022-01-17 DIAGNOSIS — Z0389 Encounter for observation for other suspected diseases and conditions ruled out: Secondary | ICD-10-CM | POA: Diagnosis not present

## 2022-01-17 LAB — EXERCISE TOLERANCE TEST
Angina Index: 0
Duke Treadmill Score: 11
Estimated workload: 12.7
Exercise duration (min): 10 min
Exercise duration (sec): 38 s
MPHR: 169 {beats}/min
Peak HR: 171 {beats}/min
Percent HR: 101 %
Rest HR: 83 {beats}/min
ST Depression (mm): 0 mm

## 2022-01-22 ENCOUNTER — Other Ambulatory Visit (HOSPITAL_BASED_OUTPATIENT_CLINIC_OR_DEPARTMENT_OTHER): Payer: Self-pay

## 2022-01-22 DIAGNOSIS — R809 Proteinuria, unspecified: Secondary | ICD-10-CM | POA: Diagnosis not present

## 2022-01-22 DIAGNOSIS — E1110 Type 2 diabetes mellitus with ketoacidosis without coma: Secondary | ICD-10-CM | POA: Diagnosis not present

## 2022-01-22 DIAGNOSIS — E1122 Type 2 diabetes mellitus with diabetic chronic kidney disease: Secondary | ICD-10-CM | POA: Diagnosis not present

## 2022-01-22 DIAGNOSIS — N182 Chronic kidney disease, stage 2 (mild): Secondary | ICD-10-CM | POA: Diagnosis not present

## 2022-01-22 DIAGNOSIS — Z1331 Encounter for screening for depression: Secondary | ICD-10-CM | POA: Diagnosis not present

## 2022-01-22 DIAGNOSIS — E663 Overweight: Secondary | ICD-10-CM | POA: Diagnosis not present

## 2022-01-22 DIAGNOSIS — E118 Type 2 diabetes mellitus with unspecified complications: Secondary | ICD-10-CM | POA: Diagnosis not present

## 2022-01-22 DIAGNOSIS — I10 Essential (primary) hypertension: Secondary | ICD-10-CM | POA: Diagnosis not present

## 2022-01-22 DIAGNOSIS — E7800 Pure hypercholesterolemia, unspecified: Secondary | ICD-10-CM | POA: Diagnosis not present

## 2022-01-22 DIAGNOSIS — Z1339 Encounter for screening examination for other mental health and behavioral disorders: Secondary | ICD-10-CM | POA: Diagnosis not present

## 2022-01-22 MED ORDER — INSULIN GLARGINE-YFGN 100 UNIT/ML ~~LOC~~ SOPN
24.0000 [IU] | PEN_INJECTOR | Freq: Every day | SUBCUTANEOUS | 3 refills | Status: DC
  Filled 2022-01-22: qty 21, 87d supply, fill #0
  Filled 2022-01-22: qty 20, 83d supply, fill #0
  Filled 2022-04-09: qty 21, 87d supply, fill #1

## 2022-02-06 ENCOUNTER — Other Ambulatory Visit (HOSPITAL_BASED_OUTPATIENT_CLINIC_OR_DEPARTMENT_OTHER): Payer: Self-pay

## 2022-02-06 MED ORDER — PENTIPS 31G X 5 MM MISC
11 refills | Status: DC
  Filled 2022-02-06: qty 100, 25d supply, fill #0
  Filled 2022-03-01: qty 100, 25d supply, fill #1
  Filled 2022-03-23: qty 100, 25d supply, fill #2
  Filled 2022-04-16: qty 100, 25d supply, fill #3
  Filled 2022-06-05: qty 100, 25d supply, fill #4
  Filled 2022-07-15: qty 100, 25d supply, fill #5
  Filled 2023-01-14: qty 100, 25d supply, fill #6

## 2022-02-07 ENCOUNTER — Encounter: Payer: Self-pay | Admitting: *Deleted

## 2022-02-07 ENCOUNTER — Other Ambulatory Visit (HOSPITAL_BASED_OUTPATIENT_CLINIC_OR_DEPARTMENT_OTHER): Payer: Self-pay

## 2022-02-07 DIAGNOSIS — Z006 Encounter for examination for normal comparison and control in clinical research program: Secondary | ICD-10-CM

## 2022-02-07 MED ORDER — ACCU-CHEK GUIDE CONTROL VI LIQD: 1 refills | Status: DC

## 2022-02-07 MED ORDER — PENTIPS 31G X 5 MM MISC
2 refills | Status: DC
  Filled 2022-05-06: qty 100, 25d supply, fill #0
  Filled 2022-09-04: qty 100, 25d supply, fill #1

## 2022-02-07 MED ORDER — ACCU-CHEK SOFTCLIX LANCETS MISC: 1 refills | Status: DC

## 2022-02-07 NOTE — Patient Instructions (Signed)
Spoke with Charles [name] about [employer]. He states he is interested in receiving more info about [facility]. Emailed him a copy of the consent to review. Encouraged him to call with any questions.

## 2022-02-08 ENCOUNTER — Encounter: Payer: Self-pay | Admitting: *Deleted

## 2022-02-08 DIAGNOSIS — Z006 Encounter for examination for normal comparison and control in clinical research program: Secondary | ICD-10-CM

## 2022-02-08 NOTE — Patient Instructions (Signed)
Spoke with Charles [name]. He states he would like to come into run in visit for [facility]. Scheduled for June 7th at 1000.

## 2022-02-09 ENCOUNTER — Other Ambulatory Visit (HOSPITAL_BASED_OUTPATIENT_CLINIC_OR_DEPARTMENT_OTHER): Payer: Self-pay

## 2022-02-13 ENCOUNTER — Other Ambulatory Visit: Payer: Self-pay | Admitting: *Deleted

## 2022-02-13 NOTE — Patient Outreach (Signed)
Monthly call, no answer, message states unavailable until after 5:30 pm. Left a message to return call after hours.

## 2022-02-14 ENCOUNTER — Other Ambulatory Visit (HOSPITAL_BASED_OUTPATIENT_CLINIC_OR_DEPARTMENT_OTHER): Payer: Self-pay

## 2022-02-14 MED ORDER — ACCU-CHEK GUIDE VI STRP
ORAL_STRIP | 5 refills | Status: DC
  Filled 2022-02-14: qty 200, 67d supply, fill #0

## 2022-02-15 ENCOUNTER — Other Ambulatory Visit: Payer: Self-pay | Admitting: *Deleted

## 2022-02-15 ENCOUNTER — Other Ambulatory Visit (HOSPITAL_BASED_OUTPATIENT_CLINIC_OR_DEPARTMENT_OTHER): Payer: Self-pay

## 2022-02-15 NOTE — Patient Outreach (Signed)
[facility] ([facility]) Care Management  02/15/2022  Child O Grissom 08/21/1971 [id number]   Second outreach for follow up on DM and nutrition classes. Pt texted back he will call me later today.  Carroll C. Spinks, MSN, GNP-BC Gerontological Nurse Practitioner [facility] Care Management [phone]  [facility] ([facility]) Care Management Geriatric Nurse Practitioner Note   02/15/2022 Name:  Charles [name] MRN:  [id number] DOB:  04/19/1971  Summary: Improving DM and triglyceride profiles.  Recommendations/Changes made from today's visit: Continue new healthy habits (diet, monitoring)  Subjective: Charles [name] is an 51 y.o. year old male who is a primary patient of Tisovec, Richard W, MD. The care management team was consulted for assistance with care management and/or care coordination needs.    Geriatric Nurse Practitioner completed Telephone Visit today.   Objective:  Medications Reviewed Today     Reviewed by Spinks, Carroll, NP (Nurse Practitioner) on 02/13/22 at 1032  Med List Status: <None>   Medication Order Taking? Sig Documenting Provider Last Dose Status Informant  Accu-Chek Softclix Lancets lancets [id number]  Use 1 lancet to check blood sugar 3 times a day.   Active   amLODipine (NORVASC) 5 MG tablet [id number] No TAKE ONE TABLET (5 MG DOSE) BY MOUTH DAILY. Burns, Kevin L., MD Taking Expired 01/16/22 2359            Med Note (SPINKS, CARROLL   Wed Dec 20, 2021  5:08 PM) Taking.  Blood Glucose Calibration (ACCU-CHEK GUIDE CONTROL) LIQD [id number]  Use application once a week.   Active   Blood Glucose Calibration (MYGLUCOHEALTH CONTROL) SOLN [id number] No 1 application. by Misc.(Non-Drug; Combo Route) route once a week. Use to check that meter is working properly once weekly. [provider] Taking Active   Blood Glucose Monitoring Suppl (ACCU-CHEK GUIDE) w/Device KIT [id number] No USE METER TO CHECK BLOOD SUGAR THREE TIMES A DAY [provider]  Taking Active   cholecalciferol (VITAMIN D3) 25 MCG (1000 UNIT) tablet [id number] No Take 2,000 Units by mouth daily. [provider] Taking Active   fenofibrate (TRICOR) 145 MG tablet [id number] No Take 1 tablet (145 mg total) by mouth daily. Croitoru, Mihai, MD Taking Active   icosapent Ethyl (VASCEPA) 1 g capsule [id number] No Take 2 capsules (2 g total) by mouth 2 (two) times daily. Croitoru, Mihai, MD Taking Active   insulin glargine-yfgn (SEMGLEE) 100 UNIT/ML Pen [id number] No Inject 24 Units into the skin daily for 30 days.  Taking Active   insulin glargine-yfgn (SEMGLEE, YFGN,) 100 UNIT/ML Pen [id number]  Inject 24 Units into the skin daily.   Active   insulin lispro (HUMALOG KWIKPEN) 100 UNIT/ML KwikPen [id number] No Inject 2-12 Units into the skin 3 (three) times daily with meals per scale. BG = 180-200: 2 units, BG = 201-250: 4 units, BG = 251-300: 6 units, BG = 301-350: 8 units, BG = 351-400: 10 units, and BG = 401-450: 12 units.  Taking Active   Insulin Pen Needle (PENTIPS) 31G X 5 MM MISC [id number]  use as directed to inject insulin up to 4 times per day   Active   Insulin Pen Needle (PENTIPS) 31G X 5 MM MISC [id number]  Use to inject insulin 4 times a day.   Active   lisinopril (ZESTRIL) 5 MG tablet [id number] No Take 1 tablet by mouth daily  Taking Active   Multiple Vitamins-Minerals (CENTURY PO) [id number] No Take by mouth. [provider] Taking Active                SDOH:  (Social Determinants of Health) assessments and interventions performed:    Care Plan  Review of patient past medical history, allergies, medications, health status, including review of consultants reports, laboratory and other test data, was performed as part of comprehensive evaluation for care management services.   Care Plan : [facility] NP Plan of Care  Updates made by Spinks, Carroll, NP since 02/15/2022 12:00 AM     Problem: Newly diagnosed DM II   Priority: High  Onset Date: 01/11/2022      Long-Range Goal: Patient will check his glucose levels bid and report levels to NP on each call over the next 3 months.   Start Date: 01/11/2022  Expected End Date: 04/16/2022  This Visit's Progress: On track  Priority: High  Note:    Update 02/15/22:  (Status: Goal on track: NO.) Long Term Goal  Evaluation of current treatment plan related to GLUCOSE MONITORING and patient's adherence to plan as established by provider Pt is checking his glucose levels tid. His levels have improved dramatically. Today his FBS was 121 and lunch time was 90. His HgbA1C in March was 14+ and now he says it is almost 1/2 of that level. Praised for his conscientious attention to detail and self monitoring. Keep up the great work!   Current Barriers:  Chronic Disease Management support and education needs related to DMII   RNCM Clinical Goal(s):  Patient will verbalize basic understanding of  DMII disease process and self health management plan as evidenced by conversations with pt. take all medications exactly as prescribed and will call provider for medication related questions as evidenced by pt report. demonstrate understanding of rationale for each prescribed medication as evidenced by pt and NP discussions. attend all scheduled medical appointments: for diabetes education and follow up as evidenced by pt report  through collaboration with RN Care manager, provider, and care team.   Interventions: Inter-disciplinary care team collaboration (see longitudinal plan of care) Evaluation of current treatment plan related to  self management and patient's adherence to plan as established by provider   Diabetes Interventions:  (Status:  New goal.) Long Term Goal Assessed patient's understanding of A1c goal: <7% Reviewed medications with patient and discussed importance of medication adherence Discussed plans with patient for ongoing care management follow up and provided patient with direct contact information  for care management team Reviewed scheduled/upcoming provider appointments including: DM/Nutrition classes, new provider and specialists (cardiology & nephrology. Advised patient, providing education and rationale, to check cbg bid and record, calling MD for findings outside established parameters No results found for: HGBA1C Pt reports His FBS are <120 and NFBS are <160.  Patient Goals/Self-Care Activities: Take all medications as prescribed Attend all scheduled provider appointments Call pharmacy for medication refills 3-7 days in advance of running out of medications Call provider office for new concerns or questions  schedule appointment with eye doctor check blood sugar at prescribed times: twice daily check feet daily for cuts, sores or redness enter blood sugar readings and medication or insulin into daily log  Follow Up Plan:  Telephone follow up appointment with care management team member scheduled for:  end of May.     Goal: Pt will begin diabetes and nutrition classes and attend all scheduled classes over the next 30 days.   Start Date: 01/11/2022  Expected End Date: 02/14/2022  This Visit's Progress: On track  Priority: High  Note:    Update  02/15/22  (Status: GOAL ATTAINED!) Short Term Goal  Evaluation   of current treatment plan related to DIABETES CLASSES and patient's adherence to plan as established by provider Pt completed his classes and is putting into action what he has learned. He is concentrating on his diet, reducing carbs. He is usually counting up his CARBS and keeping them <40 Gms /meal and <20Gms for snacks.  Current Barriers:  Chronic Disease Management support and education needs related to DMII   RNCM Clinical Goal(s):  Patient will attend all scheduled medical appointments: DM/Nutrition classes as evidenced by pt report and chart review. work with DM specialist to learn DM basics  as evidenced by pt report  through collaboration with Consulting civil engineer,  provider, and care team.   Interventions: Inter-disciplinary care team collaboration (see longitudinal plan of care) Evaluation of current treatment plan related to  self management and patient's adherence to plan as established by provider  Patient Goals/Self-Care Activities: Attend all scheduled provider appointments  Follow Up Plan:  Telephone follow up appointment with care management team member scheduled for:  end of May.       Plan: Telephone follow up appointment with care management team member scheduled for:  July 7th. Pt agrees to the plan of care.  Eulah Pont. Myrtie Neither, MSN, [facility] Gerontological Nurse Practitioner [facility] Care Management [phone]

## 2022-02-16 ENCOUNTER — Other Ambulatory Visit (HOSPITAL_BASED_OUTPATIENT_CLINIC_OR_DEPARTMENT_OTHER): Payer: Self-pay

## 2022-02-16 ENCOUNTER — Encounter (HOSPITAL_BASED_OUTPATIENT_CLINIC_OR_DEPARTMENT_OTHER): Payer: Self-pay | Admitting: Internal Medicine

## 2022-02-19 ENCOUNTER — Other Ambulatory Visit (HOSPITAL_BASED_OUTPATIENT_CLINIC_OR_DEPARTMENT_OTHER): Payer: Self-pay

## 2022-02-19 MED ORDER — INSULIN LISPRO (1 UNIT DIAL) 100 UNIT/ML (KWIKPEN)
PEN_INJECTOR | SUBCUTANEOUS | 2 refills | Status: DC
  Filled 2022-02-19: qty 30, 83d supply, fill #0
  Filled 2022-05-06: qty 30, 83d supply, fill #1
  Filled 2022-07-31: qty 30, 83d supply, fill #2

## 2022-02-20 ENCOUNTER — Encounter: Payer: Self-pay | Admitting: *Deleted

## 2022-02-20 DIAGNOSIS — Z006 Encounter for examination for normal comparison and control in clinical research program: Secondary | ICD-10-CM

## 2022-02-20 NOTE — Research (Signed)
Spoke with Mr Charles [name] to remind him of his appointment tomorrow with research at 1000.  Informed nothing to eat or drink, but ok to take meds with a sip of water. Also given the parking code. Voices understanding.

## 2022-02-21 ENCOUNTER — Encounter: Payer: 59 | Admitting: *Deleted

## 2022-02-21 ENCOUNTER — Other Ambulatory Visit: Payer: Self-pay

## 2022-02-21 VITALS — BP 103/66 | HR 82 | Temp 98.0°F | Resp 18 | Ht 72.0 in | Wt 205.6 lb

## 2022-02-21 DIAGNOSIS — Z006 Encounter for examination for normal comparison and control in clinical research program: Secondary | ICD-10-CM

## 2022-02-21 DIAGNOSIS — I2510 Atherosclerotic heart disease of native coronary artery without angina pectoris: Secondary | ICD-10-CM | POA: Diagnosis not present

## 2022-02-21 DIAGNOSIS — N179 Acute kidney failure, unspecified: Secondary | ICD-10-CM | POA: Diagnosis not present

## 2022-02-21 DIAGNOSIS — E785 Hyperlipidemia, unspecified: Secondary | ICD-10-CM | POA: Diagnosis not present

## 2022-02-21 NOTE — Research (Addendum)
Core Consent     Subject Name: Charles [name]  Subject met inclusion and exclusion criteria.  The informed consent form, study requirements and expectations were reviewed with the subject and questions and concerns were addressed prior to the signing of the consent form.  The subject verbalized understanding of the trial requirements.  The subject agreed to participate in the Core  trial and signed the informed consent at 1003 on 21-February-2022.  The informed consent was obtained prior to performance of any protocol-specific procedures for the subject.  A copy of the signed informed consent was given to the subject and a copy was placed in the subject's medical record.   Charles [name]  Protocol number 1 Amendment 2  Consent version  2     Screening Qualification Visit   Subject Number: [id number]  21-February-2022    _0 Inclusion/Exclusion Criteria   _1 Pregnancy Test (if applicable)  <[id number]>_2<\/[id number]>_4 Collection of Hematology and Lipid Panel  _3 FCS Symptoms 7 Day Recall  _4 Assessment of ER Visits, Hospitalization and Inpatient Days  _5 Adverse Events and Concomitant Medications   Charles [name] is here for screening run in visit for Core. Consent was signed after Charles [name] was given time to ask questions. Consent signed at 43. Vs taken at 1009  BP 103/66 HR 82 Temp 98.0 O2 sat 99%-RA Resp 18 Wt 205.6 lbs  Ht 6 ft EKG completed at 1014, Blood work at 1057, and Urine obtained at 1100. Dr Lia Foyer in for exam and to talk with him. Denies any abd pain since hospital stay back in April. Charles [name] states he did have to go to the ED to get more insulin. He ran out because he at the time did not have a PCP. PCP is Dr Osborne Casco. Reviewed meds no changes noted.  Current Outpatient Medications:    Accu-Chek Softclix Lancets lancets, Use 1 lancet to check blood sugar 3 times a day., Disp: 100 each, Rfl: 1   cholecalciferol (VITAMIN D3) 25 MCG (1000 UNIT) tablet, Take 2,000 Units by mouth daily., Disp: , Rfl:     fenofibrate (TRICOR) 145 MG tablet, Take 1 tablet (145 mg total) by mouth daily., Disp: 30 tablet, Rfl: 11   icosapent Ethyl (VASCEPA) 1 g capsule, Take 2 capsules (2 g total) by mouth 2 (two) times daily., Disp: 120 capsule, Rfl: 11   insulin glargine-yfgn (SEMGLEE) 100 UNIT/ML Pen, Inject 24 Units into the skin daily for 30 days., Disp: 7.2 mL, Rfl: 0   insulin glargine-yfgn (SEMGLEE, YFGN,) 100 UNIT/ML Pen, Inject 24 Units into the skin daily., Disp: 30 mL, Rfl: 3   insulin lispro (HUMALOG KWIKPEN) 100 UNIT/ML KwikPen, Inject 2-12u into the skin 3x a day with meals per sliding scale. BG=180-200: 2u; BG=201-250: 4u; BG=251-300: 6u; BG=3-1-350: 8u; BG=351-400: 10u; and BG=401-450: 12u, Disp: 30 mL, Rfl: 2   lisinopril (ZESTRIL) 5 MG tablet, Take 1 tablet by mouth daily, Disp: 90 tablet, Rfl: 3   Multiple Vitamins-Minerals (CENTURY PO), Take by mouth., Disp: , Rfl:    amLODipine (NORVASC) 5 MG tablet, TAKE ONE TABLET (5 MG DOSE) BY MOUTH DAILY., Disp: 30 tablet, Rfl: 5   Blood Glucose Calibration (ACCU-CHEK GUIDE CONTROL) LIQD, Use application once a week., Disp: 1 each, Rfl: 1   Blood Glucose Calibration (MYGLUCOHEALTH CONTROL) SOLN, 1 application. by Misc.(Non-Drug; Combo Route) route once a week. Use to check that meter is working properly once weekly., Disp: , Rfl:    Blood Glucose Monitoring Suppl (ACCU-CHEK GUIDE) w/Device KIT, USE METER TO CHECK  BLOOD SUGAR THREE TIMES A DAY, Disp: , Rfl:    glucose blood (ACCU-CHEK GUIDE) test strip, Use to test blood sugars 3 times a day, Disp: 300 each, Rfl: 5   Insulin Pen Needle (PENTIPS) 31G X 5 MM MISC, use as directed to inject insulin up to 4 times per day, Disp: 100 each, Rfl: 11   Insulin Pen Needle (PENTIPS) 31G X 5 MM MISC, Use to inject insulin 4 times a day., Disp: 100 each, Rfl: 2  Pt states he takes  Vit D Supplement started 01-16-22 Tricor for Hyperlipidemia started 03-15-21 Vascepa for Hyperlipemia started 01-09-22 Semglee for Type 2 Dm  started 12-17-21 Humalog for type 2 Dm started 01-09-22 Lisinopril for hypertension started 05-27-12 Multi Vit supplement started 01-16-22 Norvasc for hypertension started 11-03-20   Patient seen today for physical exam and screening for the CORE research study.  Patient of Dr. Loletha [name] and Dr. Debara Pickett. Presented with DKA, new onset IDDM, and pancreatitis.  Now much improved on treatment.  Cr has returned in review of outside labs from patient's online chart.  Cr last checked at 1.3 9 days after original presentation.  No chest pain.  Family history of CAD.  Marked elevation of triglycerides, and has seen Dr. Debara Pickett and Dr. Loletha [name].  Protocol, risks, benefits, objectives, drug reviewed with patient in detail.  All questions answered.    See VSS as noted. Lungs clear.  No JVD.  No carotid bruits. Cor regular without murmur Abd soft No extremity edema.   ECG no acute changes.   Patient continues followup with Dr. Debara Pickett.  Patient wishes to proceed with screening process.   Loretha Brasil. Lia Foyer, MD, [facility], [place] Director, [facility] for [facility] Research.

## 2022-02-21 NOTE — Progress Notes (Signed)
Please see my note added to note of Charles Gust, RN.  Patient seen for enrollment in [place], an ASO for hypertriglyceridemia.    Charles [name]. Lia Foyer, MD, [facility], [facility]

## 2022-02-23 ENCOUNTER — Encounter: Payer: Self-pay | Admitting: *Deleted

## 2022-02-23 DIAGNOSIS — Z006 Encounter for examination for normal comparison and control in clinical research program: Secondary | ICD-10-CM

## 2022-02-23 LAB — NMR, LIPOPROFILE
Cholesterol, Total: 197 mg/dL (ref 100–199)
HDL Particle Number: 31.2 umol/L (ref >=30.5)
HDL-C: 42 mg/dL (ref >39)
LDL Particle Number: 1571 nmol/L — ABNORMAL HIGH (ref <1000)
LDL Size: 20.5 nm — ABNORMAL LOW (ref >20.5)
LDL-C (NIH Calc): 125 mg/dL — ABNORMAL HIGH (ref 0–99)
LP-IR Score: 61 — ABNORMAL HIGH (ref <=45)
Small LDL Particle Number: 863 nmol/L — ABNORMAL HIGH (ref <=527)
Triglycerides: 170 mg/dL — ABNORMAL HIGH (ref 0–149)

## 2022-02-23 NOTE — Research (Signed)
Spoke with Charles [name] informed him that he is a screen fail for core, dur to his TG  170. Informed him  he might be eligible for [facility] research. States that he would like to come in and screen for [facility]. Scheduled for June 22 at 1000.

## 2022-02-23 NOTE — Patient Instructions (Addendum)
Charles [name]. [facility] Core-screening Run in 21-February-2022           CORE Abnormal Lab report 21-February-2022  Chemistry: BUN 24 mg/dL                                                         '[]'$ Clinically Significant  '[]'$ Not Clinically Significant  Alkaline Phosphatase 34 U/L                                  '[]'$ Clinically Significant  '[]'$ Not Clinically Significant Gamma Glutamyl Transfers (GGT) 9 U/L                '[]'$ Clinically Significant  '[]'$ Not Clinically Significant Hs-C-Reactine Protein 3.7 mg/L                             '[]'$ Clinically Significant  '[]'$ Not Clinically Significant  Hematology: Hemoglobin  13.3  g/dL                                           '[]'$ Clinically Significant  '[]'$ Not Clinically Significant   Urine Chemistry: Protein Creatinine Ratio 299  mg/g                       '[]'$ Clinically Significant  '[]'$ Not Clinically Significant Urine Albumin  13.20 mg/dL                                 '[]'$ Clinically Significant  '[]'$ Not Clinically Significant  Lipids:  Triglyceride  170  mg/dL                                         '[]'$ Clinically Significant  '[]'$ Not Clinically Significant  Looks like a screen fail due to TG level of 170. Any further action needed to be taken per the PI?

## 2022-02-26 ENCOUNTER — Other Ambulatory Visit (HOSPITAL_BASED_OUTPATIENT_CLINIC_OR_DEPARTMENT_OTHER): Payer: Self-pay

## 2022-02-26 DIAGNOSIS — R809 Proteinuria, unspecified: Secondary | ICD-10-CM | POA: Diagnosis not present

## 2022-02-26 DIAGNOSIS — E1122 Type 2 diabetes mellitus with diabetic chronic kidney disease: Secondary | ICD-10-CM | POA: Diagnosis not present

## 2022-02-26 DIAGNOSIS — I129 Hypertensive chronic kidney disease with stage 1 through stage 4 chronic kidney disease, or unspecified chronic kidney disease: Secondary | ICD-10-CM | POA: Diagnosis not present

## 2022-02-26 DIAGNOSIS — E559 Vitamin D deficiency, unspecified: Secondary | ICD-10-CM | POA: Diagnosis not present

## 2022-02-26 DIAGNOSIS — N179 Acute kidney failure, unspecified: Secondary | ICD-10-CM | POA: Diagnosis not present

## 2022-02-26 DIAGNOSIS — N182 Chronic kidney disease, stage 2 (mild): Secondary | ICD-10-CM | POA: Diagnosis not present

## 2022-02-26 DIAGNOSIS — E870 Hyperosmolality and hypernatremia: Secondary | ICD-10-CM | POA: Diagnosis not present

## 2022-02-26 MED ORDER — FREESTYLE LITE W/DEVICE KIT
PACK | 0 refills | Status: AC
  Filled 2022-02-26: qty 1, 1d supply, fill #0

## 2022-02-26 MED ORDER — GLUCOSE BLOOD VI STRP
ORAL_STRIP | 5 refills | Status: DC
  Filled 2022-02-26: qty 250, 84d supply, fill #0
  Filled 2022-05-08 – 2022-05-14 (×2): qty 250, 84d supply, fill #1
  Filled 2022-08-10: qty 250, 84d supply, fill #2
  Filled 2022-11-12: qty 300, 90d supply, fill #3

## 2022-02-26 MED ORDER — FREESTYLE LANCETS MISC
5 refills | Status: AC
  Filled 2022-02-26: qty 200, 67d supply, fill #0
  Filled 2022-05-06: qty 200, 67d supply, fill #1

## 2022-02-27 ENCOUNTER — Other Ambulatory Visit: Payer: Self-pay | Admitting: *Deleted

## 2022-02-27 ENCOUNTER — Other Ambulatory Visit (HOSPITAL_BASED_OUTPATIENT_CLINIC_OR_DEPARTMENT_OTHER): Payer: Self-pay

## 2022-02-27 MED ORDER — ROSUVASTATIN CALCIUM 20 MG PO TABS
20.0000 mg | ORAL_TABLET | Freq: Every day | ORAL | 3 refills | Status: DC
Start: 2022-02-27 — End: 2022-05-22
  Filled 2022-02-27: qty 90, 90d supply, fill #0
  Filled 2022-05-08 – 2022-05-14 (×2): qty 90, 90d supply, fill #1

## 2022-03-01 ENCOUNTER — Other Ambulatory Visit (HOSPITAL_BASED_OUTPATIENT_CLINIC_OR_DEPARTMENT_OTHER): Payer: Self-pay

## 2022-03-02 NOTE — Research (Signed)
Charles [name] Core screening run in 21-February-2022       '[]'$ Clinically Significant  '[]'$ Not Clinically Significant

## 2022-03-06 ENCOUNTER — Encounter (HOSPITAL_BASED_OUTPATIENT_CLINIC_OR_DEPARTMENT_OTHER): Payer: Self-pay | Admitting: Internal Medicine

## 2022-03-06 ENCOUNTER — Other Ambulatory Visit (HOSPITAL_BASED_OUTPATIENT_CLINIC_OR_DEPARTMENT_OTHER): Payer: Self-pay

## 2022-03-06 MED ORDER — AMLODIPINE BESYLATE 5 MG PO TABS
ORAL_TABLET | ORAL | 2 refills | Status: DC
  Filled 2022-03-06: qty 90, 90d supply, fill #0
  Filled 2022-05-08 – 2022-05-17 (×2): qty 90, 90d supply, fill #1
  Filled 2022-06-12 – 2022-08-28 (×2): qty 90, 90d supply, fill #2

## 2022-03-08 ENCOUNTER — Other Ambulatory Visit: Payer: Self-pay

## 2022-03-08 ENCOUNTER — Encounter: Payer: 59 | Admitting: *Deleted

## 2022-03-08 VITALS — BP 131/72 | HR 73 | Temp 98.4°F | Resp 16 | Ht 72.0 in | Wt 206.5 lb

## 2022-03-08 DIAGNOSIS — Z006 Encounter for examination for normal comparison and control in clinical research program: Secondary | ICD-10-CM

## 2022-03-08 NOTE — Research (Signed)
[facility]  [id number]    [id number]  SUBJECT ID:    [id number]              DATE:  08-March-2022      [x]  MALE                            []  MALE AGE: 51 ETHINICITY:   []  HISPANIC/LATINO      [x]  NON- HISPANIC/LATINO RACE:           [x]   WHITE             []  BLACK/AFRICAN AMERICAN                         []   ASIAN              []  AMERICAN INDIAN/[place] NATIVE                         []   NATIVE [place]/OTHER PACIFIC ISLANDER                         []   OTHER  FUTURE RESEARCH [x]  USE OF SAMPLES FOR FUTURE RESEARCH [x]  Consented for Sub study CTA   INCLUSION CRITERIA Consent      [x]    Pregnancy authorization []   AGE 37 or greater [x]   Triglycerides fasting 150 or greater with either: [x]   Dx of ASCVD (CAD, CVA, PAD) OR []   Increased risk for ASCVD as below [x]   Type 2 DM OR 2 or more below [x]   Men 78 or greater Woman 40 or greater []  []    Woman with Hx of preeclampsia or premature menopause (before 64) []    Family Hx of premature ASCDD (Before 51 for males, or before 67 for females  []    Current Tobacco use  []    Metabolic syndrome []    Hypertension with Treatment  [x]    CKD stage 3 Or (GFR 30-59)  []    LDL-C 160 or greater LDL-C 100 or greater on therapy to lower  []    Elevated high-sensitivity C Reactive protein (>2.0)  []   Elevated lipoprotein (a) (>25m/dL or 124nmol/L) OR  []   Triglycerides fasting 500 or greater  []   Lipid-lowering med (for at least 4 weeks) [place] to comply with diet and lifestyle recommendations  [x]   Females must be non-pregnant and non-lactating and EITHER  []   Surgically sterile, post-menopausal, abstinent OR Use highly effective contraceptive at time of consent until at least 30 weeks after last dose of study drug  []   Males must be surgical sterile, abstinent or using a highly effective contraceptive at time of consent until at least 30 weeks after the last dose of study drug   [x]     EXCLUSION CRITERIA           N/A                                             [x]  Major surgery, peripheral revascularization, or non-urgent PCI within 3 months prior to screening, or planned major surgery or major procedure during the study []   Active pancreatitis within 4 weeks prior to screening []   Acute coronary syndrome or CVA/TIA within 3 months of screening []   Screening labs: ALT  or AST >3.0 x ULN Total bilirubin >1.5 ULN unless due to Gilbert's syndrome GFR >30 Urine Protein/creatine ratio >500 Uncontrolled HTN (BP>180/100 despite Treatment Uncontrolled hypothyroidism TSH>1.5 and T4 < LLN, or Hormone therapy not stable for 4 weeks or greater  []  []  []  []  []  []   DM newly dx within 12 weeks of screening A1c > 9.5 at screening  []  []   Change in basal insulin >20% within 3 months prior to screening  []   Type 1 Dm: episode of DKA or > 3 episodes of severe hypo glycerides with on 6 months prior to screening   Active infections, HIV, Hep C, Hep B []   Active infection requiring systemic antiviral or antimicrobial tx that will not be complete prior to study day 1 or active Covid 19 infection not resolved by study day 1 []   Malignancy within 5 years (except for non-melanoma skin ca, cervical in situ ca, breast ductal ca in situ or stage 1 prostate Ca that has been tx []   Hypersensitivity to the active substance (olezarsen or placebo) []   Tx with another investigational drug or devise within 1 month or screening  []   Previous tx with an oligonucleotide within 4 months of screening []   Con meds/ procedure restrictions:   Systemic corticosteroids of anabolic steroids within 6 weeks prior to screening and during the study unless approved   []   Use of bile acids resins (colestipol or Colesevelam) within 4 weeks prior to screening or planned during the study  []   Plasma apheresis within 4 weeks prior to screening or planned during the study  []   Change in meds known to exacerbate hypertriglyceridemia (beta blockers, thiazides,  isotretinoin, oral antidiabetic meds, tamoxifen, estrogens or progestins within 4 weeks prior to screening  []    Change or expected need for significant change in titration of therapies known to significantly reduce TG (GLP-1 agonists, other incretin mimetics, Phentermine/topiramate, naltrexone/bupropion, Xenical, or bariatric surgery within 3 months prior to screening  []    Change in antipsychotic meds within 30 days of screening or >456m within 60 days of Screening  []    Blood or plasma donation of 50-4970mwithin 30 days of screening or>499 within 60 days of screening []   Unwilling to comply with procedures, following up, or unwillingness to cooperate fully with the investigator []   ETOH abuse or recent (<1 year) or other substance abuse []     [facility] Information and Authorization Form for Data Collections Related to the Pregnancy of a Participant's male Partner    Subject Name: Charles POMPLUNSubject met inclusion and exclusion criteria.  The informed consent form, study requirements and expectations were reviewed with the subject and questions and concerns were addressed prior to the signing of the consent form.  The subject verbalized understanding of the trial requirements.  The subject agreed to participate in the Core  trial and signed the informed consent at 1019 on 08-March-2022.  The informed consent was obtained prior to performance of any protocol-specific procedures for the subject.  A copy of the signed informed consent was given to the subject and a copy was placed in the subject's medical record.   ScBeverly Gustard  Approved 15-November-2021   Mr RoHendrik Donaths here for [facility] screening run in visit. He reports no abd pain, no visits to the Ed or Urgent care.  Meds reviewed no changes noted.  Consent signed after pt has had time to review and ask questions. Signed at 1019. VS taken at 1021 131/72  HR 73 Temp 98.4 Spo2 97% Resp 16 Wt 206.5 lbs Ht 6 ft Blood work  drawn at [employer] Urine obtained at 1044 Next visit scheduled for July 5 at 1000   Current Outpatient Medications:    Accu-Chek Softclix Lancets lancets, Use 1 lancet to check blood sugar 3 times a day., Disp: 100 each, Rfl: 1   amLODipine (NORVASC) 5 MG tablet, TAKE ONE TABLET (5 MG DOSE) BY MOUTH DAILY., Disp: 90 tablet, Rfl: 2   Blood Glucose Calibration (ACCU-CHEK GUIDE CONTROL) LIQD, Use application once a week., Disp: 1 each, Rfl: 1   Blood Glucose Calibration (MYGLUCOHEALTH CONTROL) SOLN, 1 application. by Misc.(Non-Drug; Combo Route) route once a week. Use to check that meter is working properly once weekly., Disp: , Rfl:    Blood Glucose Monitoring Suppl (ACCU-CHEK GUIDE) w/Device KIT, USE METER TO CHECK BLOOD SUGAR THREE TIMES A DAY, Disp: , Rfl:    Blood Glucose Monitoring Suppl (FREESTYLE LITE) w/Device KIT, Use to check blood sugars 3 times daily, Disp: 1 kit, Rfl: 0   cholecalciferol (VITAMIN D3) 25 MCG (1000 UNIT) tablet, Take 2,000 Units by mouth daily., Disp: , Rfl:    fenofibrate (TRICOR) 145 MG tablet, Take 1 tablet (145 mg total) by mouth daily., Disp: 30 tablet, Rfl: 11   glucose blood (ACCU-CHEK GUIDE) test strip, Use to test blood sugars 3 times a day, Disp: 300 each, Rfl: 5   glucose blood test strip, Use to test blood sugars 3 times daily, Disp: 300 each, Rfl: 5   icosapent Ethyl (VASCEPA) 1 g capsule, Take 2 capsules (2 g total) by mouth 2 (two) times daily., Disp: 120 capsule, Rfl: 11   insulin glargine-yfgn (SEMGLEE) 100 UNIT/ML Pen, Inject 24 Units into the skin daily for 30 days., Disp: 7.2 mL, Rfl: 0   insulin glargine-yfgn (SEMGLEE, YFGN,) 100 UNIT/ML Pen, Inject 24 Units into the skin daily., Disp: 30 mL, Rfl: 3   insulin lispro (HUMALOG KWIKPEN) 100 UNIT/ML KwikPen, Inject 2-12u into the skin 3x a day with meals per sliding scale. BG=180-200: 2u; BG=201-250: 4u; BG=251-300: 6u; BG=3-1-350: 8u; BG=351-400: 10u; and BG=401-450: 12u, Disp: 30 mL, Rfl: 2   Insulin Pen  Needle (PENTIPS) 31G X 5 MM MISC, use as directed to inject insulin up to 4 times per day, Disp: 100 each, Rfl: 11   Insulin Pen Needle (PENTIPS) 31G X 5 MM MISC, Use to inject insulin 4 times a day., Disp: 100 each, Rfl: 2   Lancets (FREESTYLE) lancets, Use as directed to test blood sugars 3 times daily, Disp: 300 each, Rfl: 5   lisinopril (ZESTRIL) 5 MG tablet, Take 1 tablet by mouth daily, Disp: 90 tablet, Rfl: 3   Multiple Vitamins-Minerals (CENTURY PO), Take by mouth., Disp: , Rfl:    rosuvastatin (CRESTOR) 20 MG tablet, Take 1 tablet (20 mg total) by mouth daily., Disp: 90 tablet, Rfl: 3

## 2022-03-12 ENCOUNTER — Other Ambulatory Visit (HOSPITAL_BASED_OUTPATIENT_CLINIC_OR_DEPARTMENT_OTHER): Payer: Self-pay

## 2022-03-16 NOTE — Research (Addendum)
[place] Screening run in 08-March-2022      Lipids:  Apolipoprotein B48 4.05 mg/dL             [] Clinically Significant  [x] Not Clinically Significant   Any further action needed to be taken per the PI?  No  Pixie Casino, MD, [facility], [place] Director of the Advanced Lipid Disorders &  Cardiovascular Risk Reduction Clinic Diplomate of the American Board of Clinical Lipidology Attending Cardiologist  Direct Dial: [phone]  Fax: [phone]  Website:  [email or web address]

## 2022-03-19 ENCOUNTER — Encounter: Payer: Self-pay | Admitting: *Deleted

## 2022-03-19 DIAGNOSIS — Z006 Encounter for examination for normal comparison and control in clinical research program: Secondary | ICD-10-CM

## 2022-03-19 NOTE — Research (Signed)
Message left to remind Charles [name] of his appointment July 5th at 1000.

## 2022-03-21 ENCOUNTER — Encounter: Payer: 59 | Admitting: *Deleted

## 2022-03-21 ENCOUNTER — Other Ambulatory Visit: Payer: Self-pay

## 2022-03-21 VITALS — BP 129/83 | HR 68 | Temp 98.3°F | Resp 16

## 2022-03-21 DIAGNOSIS — Z006 Encounter for examination for normal comparison and control in clinical research program: Secondary | ICD-10-CM

## 2022-03-21 NOTE — Research (Signed)
     Screening Qualification Visit-[facility]   Subject Number: [id number]                         Date:21-March-2022     '[x]'$ Inclusion/Exclusion Criteria   '[x]'$ Collection of labs pre protocol  $RemoveB'[x]'ugtlWYLS$ Assessment of ER Visits, Hospitalization and Inpatient Days  $Rem'[x]'PrRT$ Adverse Events and Concomitant Medications  Charles [name] here for Qualification visit with [facility]. He reports no abd pain, or other pain. No visits to the ED or Urgent care. VS taken at 0954 BP-129/83 O2 Sat 100% HR 68 Resp 16 Temp 98.3 Blood work drawn at 1003. Reviewed meds no changes noted. Scheduled next appointment for July 19 at 1000.  Current Outpatient Medications:    amLODipine (NORVASC) 5 MG tablet, TAKE ONE TABLET (5 MG DOSE) BY MOUTH DAILY., Disp: 90 tablet, Rfl: 2   Blood Glucose Monitoring Suppl (FREESTYLE LITE) w/Device KIT, Use to check blood sugars 3 times daily, Disp: 1 kit, Rfl: 0   cholecalciferol (VITAMIN D3) 25 MCG (1000 UNIT) tablet, Take 2,000 Units by mouth daily., Disp: , Rfl:    fenofibrate (TRICOR) 145 MG tablet, Take 1 tablet (145 mg total) by mouth daily., Disp: 30 tablet, Rfl: 11   glucose blood test strip, Use to test blood sugars 3 times daily, Disp: 300 each, Rfl: 5   icosapent Ethyl (VASCEPA) 1 g capsule, Take 2 capsules (2 g total) by mouth 2 (two) times daily., Disp: 120 capsule, Rfl: 11   insulin glargine-yfgn (SEMGLEE) 100 UNIT/ML Pen, Inject 24 Units into the skin daily for 30 days., Disp: 7.2 mL, Rfl: 0   insulin glargine-yfgn (SEMGLEE, YFGN,) 100 UNIT/ML Pen, Inject 24 Units into the skin daily., Disp: 30 mL, Rfl: 3   insulin lispro (HUMALOG KWIKPEN) 100 UNIT/ML KwikPen, Inject 2-12u into the skin 3x a day with meals per sliding scale. BG=180-200: 2u; BG=201-250: 4u; BG=251-300: 6u; BG=3-1-350: 8u; BG=351-400: 10u; and BG=401-450: 12u, Disp: 30 mL, Rfl: 2   Insulin Pen Needle (PENTIPS) 31G X 5 MM MISC, use as directed to inject insulin up to 4 times per day, Disp: 100 each, Rfl: 11   Insulin Pen  Needle (PENTIPS) 31G X 5 MM MISC, Use to inject insulin 4 times a day., Disp: 100 each, Rfl: 2   Lancets (FREESTYLE) lancets, Use as directed to test blood sugars 3 times daily, Disp: 300 each, Rfl: 5   lisinopril (ZESTRIL) 5 MG tablet, Take 1 tablet by mouth daily, Disp: 90 tablet, Rfl: 3   Multiple Vitamins-Minerals (CENTURY PO), Take by mouth., Disp: , Rfl:    rosuvastatin (CRESTOR) 20 MG tablet, Take 1 tablet (20 mg total) by mouth daily., Disp: 90 tablet, Rfl: 3   Accu-Chek Softclix Lancets lancets, Use 1 lancet to check blood sugar 3 times a day., Disp: 100 each, Rfl: 1   Blood Glucose Calibration (ACCU-CHEK GUIDE CONTROL) LIQD, Use application once a week., Disp: 1 each, Rfl: 1   Blood Glucose Monitoring Suppl (ACCU-CHEK GUIDE) w/Device KIT, USE METER TO CHECK BLOOD SUGAR THREE TIMES A DAY, Disp: , Rfl:    glucose blood (ACCU-CHEK GUIDE) test strip, Use to test blood sugars 3 times a day, Disp: 300 each, Rfl: 5

## 2022-03-21 NOTE — Progress Notes (Signed)
Screening Qualification Visit-[facility]   Subject Number:[id number]      [id number]  _0 Inclusion/Exclusion Criteria    _1 Collection of labs pre protocol   _2 Assessment of ER Visits, Hospitalization and Inpatient Days  _3 Adverse Events and Concomitant Medications  Charles [name] here for qualification visit for [facility].  He reports no abd pain, or other pain. No visits to the ED or Urgent care since last visit. Reviewed medications, no changes noted. Vs taken at 0954. Blood work drawn at 1003. Next visit scheduled for July 19 at 1000. VS 98.3-temp 100%-o2 sat 68-heart rate 129/83-bp 16-resp.  Current Outpatient Medications:    amLODipine (NORVASC) 5 MG tablet, TAKE ONE TABLET (5 MG DOSE) BY MOUTH DAILY., Disp: 90 tablet, Rfl: 2   Blood Glucose Monitoring Suppl (FREESTYLE LITE) w/Device KIT, Use to check blood sugars 3 times daily, Disp: 1 kit, Rfl: 0   cholecalciferol (VITAMIN D3) 25 MCG (1000 UNIT) tablet, Take 2,000 Units by mouth daily., Disp: , Rfl:    fenofibrate (TRICOR) 145 MG tablet, Take 1 tablet (145 mg total) by mouth daily., Disp: 30 tablet, Rfl: 11   glucose blood test strip, Use to test blood sugars 3 times daily, Disp: 300 each, Rfl: 5   icosapent Ethyl (VASCEPA) 1 g capsule, Take 2 capsules (2 g total) by mouth 2 (two) times daily., Disp: 120 capsule, Rfl: 11   insulin glargine-yfgn (SEMGLEE) 100 UNIT/ML Pen, Inject 24 Units into the skin daily for 30 days., Disp: 7.2 mL, Rfl: 0   insulin glargine-yfgn (SEMGLEE, YFGN,) 100 UNIT/ML Pen, Inject 24 Units into the skin daily., Disp: 30 mL, Rfl: 3   insulin lispro (HUMALOG KWIKPEN) 100 UNIT/ML KwikPen, Inject 2-12u into the skin 3x a day with meals per sliding scale. BG=180-200: 2u; BG=201-250: 4u; BG=251-300: 6u; BG=3-1-350: 8u; BG=351-400: 10u; and BG=401-450: 12u, Disp: 30 mL, Rfl: 2   Insulin Pen Needle (PENTIPS) 31G X 5 MM MISC, use as directed to inject insulin up to 4 times per day, Disp: 100 each, Rfl: 11    Insulin Pen Needle (PENTIPS) 31G X 5 MM MISC, Use to inject insulin 4 times a day., Disp: 100 each, Rfl: 2   Lancets (FREESTYLE) lancets, Use as directed to test blood sugars 3 times daily, Disp: 300 each, Rfl: 5   lisinopril (ZESTRIL) 5 MG tablet, Take 1 tablet by mouth daily, Disp: 90 tablet, Rfl: 3   Multiple Vitamins-Minerals (CENTURY PO), Take by mouth., Disp: , Rfl:    rosuvastatin (CRESTOR) 20 MG tablet, Take 1 tablet (20 mg total) by mouth daily., Disp: 90 tablet, Rfl: 3   Accu-Chek Softclix Lancets lancets, Use 1 lancet to check blood sugar 3 times a day., Disp: 100 each, Rfl: 1   Blood Glucose Calibration (ACCU-CHEK GUIDE CONTROL) LIQD, Use application once a week., Disp: 1 each, Rfl: 1   Blood Glucose Monitoring Suppl (ACCU-CHEK GUIDE) w/Device KIT, USE METER TO CHECK BLOOD SUGAR THREE TIMES A DAY, Disp: , Rfl:    glucose blood (ACCU-CHEK GUIDE) test strip, Use to test blood sugars 3 times a day, Disp: 300 each, Rfl: 5

## 2022-03-22 ENCOUNTER — Other Ambulatory Visit: Payer: Self-pay | Admitting: *Deleted

## 2022-03-22 NOTE — Patient Outreach (Signed)
[facility]) Care Management  03/22/2022  FAHD GALEA February 19, 1971 [id number]  Telephone outreach for case closure. Pt was unable to answer the phone per his answering machine message until after 5:30 pm. Left a message to return my call. Need to know how his diabetes glucose levels are and if there have been any changes. Advised of intent to close his case providing his in in stable condition.  Eulah Pont. Myrtie Neither, MSN, [facility] Gerontological Nurse Practitioner [facility] Care Management [phone]

## 2022-03-22 NOTE — Patient Outreach (Signed)
[facility]) Care Management Geriatric Nurse Practitioner Note   03/22/2022 Name:  Charles [name] MRN:  [id number] DOB:  06-16-1971  Summary: Pt is doing very well with his self management. He has met his goals!  Recommendations/Changes made from today's visit: Continue your great work! Your glucose levels are excellent, you are exercising and really paying attention to your recommended diet !  Subjective: Charles [name] is an 51 y.o. year old male who is a primary patient of [name], Charles Him, MD. The care management team was consulted for assistance with care management and/or care coordination needs.    Geriatric Nurse Practitioner completed Telephone Visit today.   Outpatient Encounter Medications as of 03/22/2022  Medication Sig   Accu-Chek Softclix Lancets lancets Use 1 lancet to check blood sugar 3 times a day.   amLODipine (NORVASC) 5 MG tablet TAKE ONE TABLET (5 MG DOSE) BY MOUTH DAILY.   Blood Glucose Calibration (ACCU-CHEK GUIDE CONTROL) LIQD Use application once a week.   Blood Glucose Monitoring Suppl (ACCU-CHEK GUIDE) w/Device KIT USE METER TO CHECK BLOOD SUGAR THREE TIMES A DAY   Blood Glucose Monitoring Suppl (FREESTYLE LITE) w/Device KIT Use to check blood sugars 3 times daily   cholecalciferol (VITAMIN D3) 25 MCG (1000 UNIT) tablet Take 2,000 Units by mouth daily.   fenofibrate (TRICOR) 145 MG tablet Take 1 tablet (145 mg total) by mouth daily.   glucose blood (ACCU-CHEK GUIDE) test strip Use to test blood sugars 3 times a day   glucose blood test strip Use to test blood sugars 3 times daily   icosapent Ethyl (VASCEPA) 1 g capsule Take 2 capsules (2 g total) by mouth 2 (two) times daily.   insulin glargine-yfgn (SEMGLEE) 100 UNIT/ML Pen Inject 24 Units into the skin daily for 30 days.   insulin glargine-yfgn (SEMGLEE, YFGN,) 100 UNIT/ML Pen Inject 24 Units into the skin daily.   insulin lispro (HUMALOG KWIKPEN) 100 UNIT/ML KwikPen Inject 2-12u into the  skin 3x a day with meals per sliding scale. BG=180-200: 2u; BG=201-250: 4u; BG=251-300: 6u; BG=3-1-350: 8u; BG=351-400: 10u; and BG=401-450: 12u   Insulin Pen Needle (PENTIPS) 31G X 5 MM MISC use as directed to inject insulin up to 4 times per day   Insulin Pen Needle (PENTIPS) 31G X 5 MM MISC Use to inject insulin 4 times a day.   Lancets (FREESTYLE) lancets Use as directed to test blood sugars 3 times daily   lisinopril (ZESTRIL) 5 MG tablet Take 1 tablet by mouth daily   Multiple Vitamins-Minerals (CENTURY PO) Take by mouth.   rosuvastatin (CRESTOR) 20 MG tablet Take 1 tablet (20 mg total) by mouth daily.   No facility-administered encounter medications on file as of 03/22/2022.    Care Plan  Review of patient past medical history, allergies, medications, health status, including review of consultants reports, laboratory and other test data, was performed as part of comprehensive evaluation for care management services.   Care Plan : [facility] NP Plan of Care  Updates made by Charles Lair, NP since 03/22/2022 12:00 AM     Problem: Newly diagnosed DM II   Priority: High  Onset Date: 01/11/2022     Long-Range Goal: Patient will check his glucose levels bid and report levels to NP on each call over the next 3 months.   Start Date: 01/11/2022  Expected End Date: 04/16/2022  This Visit's Progress: On track  Recent Progress: On track  Priority: High  Note:    Update  03/22/22:  (Status: Goal Met.) Long Term Goal  Evaluation of current treatment plan related to GLUCOSE MONITORING and patient's adherence to plan as established by provider [name] reports he glucose levels have been in the 80's-120's. His weight is down to 203# and he is now wearing a 32 waist vs 36.!!! He will be participating in a new lipid study starting on the 19th for one year. If this is successful it will be a great advantage to him to improve his lipid profile.  Update 02/15/22:  (Status: Goal on track: NO.) Long Term Goal   Evaluation of current treatment plan related to [place] and patient's adherence to plan as established by provider Pt is checking his glucose levels tid. His levels have improved dramatically. Today his FBS was 121 and lunch time was 90. His HgbA1C in March was 14+ and now he says it is almost 1/2 of that level. Praised for his conscientious attention to detail and self monitoring. Keep up the great work!   Current Barriers:  Chronic Disease Management support and education needs related to DMII   RNCM Clinical Goal(s):  Patient will verbalize basic understanding of  DMII disease process and self health management plan as evidenced by conversations with pt. take all medications exactly as prescribed and will call provider for medication related questions as evidenced by pt report. demonstrate understanding of rationale for each prescribed medication as evidenced by pt and NP discussions. attend all scheduled medical appointments: for diabetes education and follow up as evidenced by pt report  through collaboration with RN Care manager, provider, and care team.   Interventions: Inter-disciplinary care team collaboration (see longitudinal plan of care) Evaluation of current treatment plan related to  self management and patient's adherence to plan as established by provider   Diabetes Interventions:  (Status:  New goal.) Long Term Goal Assessed patient's understanding of A1c goal: <7% Reviewed medications with patient and discussed importance of medication adherence Discussed plans with patient for ongoing care management follow up and provided patient with direct contact information for care management team Reviewed scheduled/upcoming provider appointments including: DM/Nutrition classes, new provider and specialists (cardiology & nephrology. Advised patient, providing education and rationale, to check cbg bid and record, calling MD for findings outside established parameters No  results found for: HGBA1C Pt reports His FBS are <120 and NFBS are <160.  Patient Goals/Self-Care Activities: Take all medications as prescribed Attend all scheduled provider appointments Call pharmacy for medication refills 3-7 days in advance of running out of medications Call provider office for new concerns or questions  schedule appointment with eye doctor check blood sugar at prescribed times: twice daily check feet daily for cuts, sores or redness enter blood sugar readings and medication or insulin into daily log  Follow Up Plan:  Telephone follow up appointment with care management team member scheduled for:  end of May.      Plan: No further follow up required: Patient has met his goals.  Eulah Pont. Myrtie Neither, MSN, [facility] Gerontological Nurse Practitioner [facility] Care Management [phone]

## 2022-03-23 ENCOUNTER — Encounter: Payer: Self-pay | Admitting: *Deleted

## 2022-03-23 ENCOUNTER — Other Ambulatory Visit (HOSPITAL_BASED_OUTPATIENT_CLINIC_OR_DEPARTMENT_OTHER): Payer: Self-pay

## 2022-03-23 DIAGNOSIS — Z006 Encounter for examination for normal comparison and control in clinical research program: Secondary | ICD-10-CM

## 2022-03-23 NOTE — Research (Addendum)
Spoke with Charles [name] about lab results. He states he is ok to come in to be retested. Will retest him July 19 at 1000

## 2022-03-23 NOTE — Research (Addendum)
Amador Cunas. Mckim [facility] Qualification 21-March-2022       [facility] abnormal labs  21-March-2022  Hematology: Hemoglobin 13.1  g/dL                '[]'$ Clinically Significant  '[x]'$ Not Clinically Significant    Lipids:  Triglyceride      117  mg/dL          '[]'$ Clinically Significant  '[x]'$ Not Clinically Significant  He can be retested, if he agrees.   Any further action needed to be taken per the PI?  NO  Pixie Casino, MD, [facility], [place] Director of the Advanced Lipid Disorders &  Cardiovascular Risk Reduction Clinic Diplomate of the American Board of Clinical Lipidology Attending Cardiologist  Direct Dial: [phone]  Fax: [phone]  Website:  [email or web address]

## 2022-03-26 NOTE — Research (Signed)
[facility] Qualification Lab report

## 2022-03-27 ENCOUNTER — Encounter (HOSPITAL_COMMUNITY): Payer: Self-pay

## 2022-03-27 ENCOUNTER — Other Ambulatory Visit (HOSPITAL_BASED_OUTPATIENT_CLINIC_OR_DEPARTMENT_OTHER): Payer: Self-pay

## 2022-03-27 ENCOUNTER — Other Ambulatory Visit (HOSPITAL_COMMUNITY): Payer: Self-pay | Admitting: Emergency Medicine

## 2022-03-27 ENCOUNTER — Other Ambulatory Visit (HOSPITAL_COMMUNITY): Payer: Self-pay | Admitting: Internal Medicine

## 2022-03-27 DIAGNOSIS — Z006 Encounter for examination for normal comparison and control in clinical research program: Secondary | ICD-10-CM

## 2022-03-27 DIAGNOSIS — R079 Chest pain, unspecified: Secondary | ICD-10-CM

## 2022-03-27 MED ORDER — METOPROLOL TARTRATE 100 MG PO TABS
100.0000 mg | ORAL_TABLET | Freq: Once | ORAL | 0 refills | Status: DC
  Filled 2022-03-27: qty 1, 1d supply, fill #0

## 2022-03-29 ENCOUNTER — Ambulatory Visit (HOSPITAL_COMMUNITY): Payer: 59

## 2022-03-29 NOTE — Research (Signed)
  WITHIN NORMAL LIMITS

## 2022-04-04 ENCOUNTER — Encounter: Payer: 59 | Admitting: *Deleted

## 2022-04-04 ENCOUNTER — Other Ambulatory Visit: Payer: Self-pay

## 2022-04-04 ENCOUNTER — Other Ambulatory Visit (HOSPITAL_COMMUNITY): Payer: Self-pay

## 2022-04-04 DIAGNOSIS — Z006 Encounter for examination for normal comparison and control in clinical research program: Secondary | ICD-10-CM

## 2022-04-04 NOTE — Progress Notes (Signed)
Mr Charles [name] here for re test Qualification Visit Blood work drawn at 1020  reports no visits to the ED or Urgent care. Denies abd pain, or any other pain. Reports no med changes.   Current Outpatient Medications:    Accu-Chek Softclix Lancets lancets, Use 1 lancet to check blood sugar 3 times a day., Disp: 100 each, Rfl: 1   amLODipine (NORVASC) 5 MG tablet, TAKE ONE TABLET (5 MG DOSE) BY MOUTH DAILY., Disp: 90 tablet, Rfl: 2   Blood Glucose Calibration (ACCU-CHEK GUIDE CONTROL) LIQD, Use application once a week., Disp: 1 each, Rfl: 1   Blood Glucose Monitoring Suppl (ACCU-CHEK GUIDE) w/Device KIT, USE METER TO CHECK BLOOD SUGAR THREE TIMES A DAY, Disp: , Rfl:    Blood Glucose Monitoring Suppl (FREESTYLE LITE) w/Device KIT, Use to check blood sugars 3 times daily, Disp: 1 kit, Rfl: 0   cholecalciferol (VITAMIN D3) 25 MCG (1000 UNIT) tablet, Take 2,000 Units by mouth daily., Disp: , Rfl:    fenofibrate (TRICOR) 145 MG tablet, Take 1 tablet (145 mg total) by mouth daily., Disp: 30 tablet, Rfl: 11   glucose blood (ACCU-CHEK GUIDE) test strip, Use to test blood sugars 3 times a day, Disp: 300 each, Rfl: 5   glucose blood test strip, Use to test blood sugars 3 times daily, Disp: 300 each, Rfl: 5   icosapent Ethyl (VASCEPA) 1 g capsule, Take 2 capsules (2 g total) by mouth 2 (two) times daily., Disp: 120 capsule, Rfl: 11   insulin glargine-yfgn (SEMGLEE) 100 UNIT/ML Pen, Inject 24 Units into the skin daily for 30 days., Disp: 7.2 mL, Rfl: 0   insulin glargine-yfgn (SEMGLEE, YFGN,) 100 UNIT/ML Pen, Inject 24 Units into the skin daily., Disp: 30 mL, Rfl: 3   insulin lispro (HUMALOG KWIKPEN) 100 UNIT/ML KwikPen, Inject 2-12u into the skin 3x a day with meals per sliding scale. BG=180-200: 2u; BG=201-250: 4u; BG=251-300: 6u; BG=3-1-350: 8u; BG=351-400: 10u; and BG=401-450: 12u, Disp: 30 mL, Rfl: 2   Insulin Pen Needle (PENTIPS) 31G X 5 MM MISC, use as directed to inject insulin up to 4 times per day, Disp: 100  each, Rfl: 11   Insulin Pen Needle (PENTIPS) 31G X 5 MM MISC, Use to inject insulin 4 times a day., Disp: 100 each, Rfl: 2   Lancets (FREESTYLE) lancets, Use as directed to test blood sugars 3 times daily, Disp: 300 each, Rfl: 5   lisinopril (ZESTRIL) 5 MG tablet, Take 1 tablet by mouth daily, Disp: 90 tablet, Rfl: 3   metoprolol tartrate (LOPRESSOR) 100 MG tablet, Take 1 tablet by mouth 2 hours prior to cardiac CT for HR control IF HR >55bpm., Disp: 1 tablet, Rfl: 0   Multiple Vitamins-Minerals (CENTURY PO), Take by mouth., Disp: , Rfl:    rosuvastatin (CRESTOR) 20 MG tablet, Take 1 tablet (20 mg total) by mouth daily., Disp: 90 tablet, Rfl: 3

## 2022-04-04 NOTE — Research (Cosign Needed Addendum)
[facility] -Re test qualification visit Charles [name] Charles [name] here for re test qualification visit. Blood drawn at 1020. Reports no change in his medications, no abd pain, or pain else where. No visits to the ED or urgent care.  Current Outpatient Medications:    Accu-Chek Softclix Lancets lancets, Use 1 lancet to check blood sugar 3 times a day., Disp: 100 each, Rfl: 1   amLODipine (NORVASC) 5 MG tablet, TAKE ONE TABLET (5 MG DOSE) BY MOUTH DAILY., Disp: 90 tablet, Rfl: 2   Blood Glucose Calibration (ACCU-CHEK GUIDE CONTROL) LIQD, Use application once a week., Disp: 1 each, Rfl: 1   Blood Glucose Monitoring Suppl (ACCU-CHEK GUIDE) w/Device KIT, USE METER TO CHECK BLOOD SUGAR THREE TIMES A DAY, Disp: , Rfl:    Blood Glucose Monitoring Suppl (FREESTYLE LITE) w/Device KIT, Use to check blood sugars 3 times daily, Disp: 1 kit, Rfl: 0   cholecalciferol (VITAMIN D3) 25 MCG (1000 UNIT) tablet, Take 2,000 Units by mouth daily., Disp: , Rfl:    fenofibrate (TRICOR) 145 MG tablet, Take 1 tablet (145 mg total) by mouth daily., Disp: 30 tablet, Rfl: 11   glucose blood (ACCU-CHEK GUIDE) test strip, Use to test blood sugars 3 times a day, Disp: 300 each, Rfl: 5   glucose blood test strip, Use to test blood sugars 3 times daily, Disp: 300 each, Rfl: 5   icosapent Ethyl (VASCEPA) 1 g capsule, Take 2 capsules (2 g total) by mouth 2 (two) times daily., Disp: 120 capsule, Rfl: 11   insulin glargine-yfgn (SEMGLEE) 100 UNIT/ML Pen, Inject 24 Units into the skin daily for 30 days., Disp: 7.2 mL, Rfl: 0   insulin glargine-yfgn (SEMGLEE, YFGN,) 100 UNIT/ML Pen, Inject 24 Units into the skin daily., Disp: 30 mL, Rfl: 3   insulin lispro (HUMALOG KWIKPEN) 100 UNIT/ML KwikPen, Inject 2-12u into the skin 3x a day with meals per sliding scale. BG=180-200: 2u; BG=201-250: 4u; BG=251-300: 6u; BG=3-1-350: 8u; BG=351-400: 10u; and BG=401-450: 12u, Disp: 30 mL, Rfl: 2   Insulin Pen Needle (PENTIPS) 31G X 5 MM MISC, use as directed  to inject insulin up to 4 times per day, Disp: 100 each, Rfl: 11   Insulin Pen Needle (PENTIPS) 31G X 5 MM MISC, Use to inject insulin 4 times a day., Disp: 100 each, Rfl: 2   Lancets (FREESTYLE) lancets, Use as directed to test blood sugars 3 times daily, Disp: 300 each, Rfl: 5   lisinopril (ZESTRIL) 5 MG tablet, Take 1 tablet by mouth daily, Disp: 90 tablet, Rfl: 3   metoprolol tartrate (LOPRESSOR) 100 MG tablet, Take 1 tablet by mouth 2 hours prior to cardiac CT for HR control IF HR >55bpm., Disp: 1 tablet, Rfl: 0   Multiple Vitamins-Minerals (CENTURY PO), Take by mouth., Disp: , Rfl:    rosuvastatin (CRESTOR) 20 MG tablet, Take 1 tablet (20 mg total) by mouth daily., Disp: 90 tablet, Rfl: 3

## 2022-04-06 NOTE — Research (Cosign Needed)
[place] Qualification retest 1 04-April-2022       Lipids:  Triglyceride    263      mg/dL   '[]'$ Clinically Significant  '[x]'$ Not Clinically Significant   Any further action needed to be taken per the PI?  NO  Pixie Casino, MD, [facility], [place] Director of the Advanced Lipid Disorders &  Cardiovascular Risk Reduction Clinic Diplomate of the American Board of Clinical Lipidology Attending Cardiologist  Direct Dial: [phone]  Fax: [phone]  Website:  [email or web address]

## 2022-04-09 ENCOUNTER — Encounter: Payer: 59 | Admitting: *Deleted

## 2022-04-09 ENCOUNTER — Other Ambulatory Visit (HOSPITAL_BASED_OUTPATIENT_CLINIC_OR_DEPARTMENT_OTHER): Payer: Self-pay

## 2022-04-09 ENCOUNTER — Other Ambulatory Visit: Payer: Self-pay

## 2022-04-09 VITALS — BP 119/74 | HR 67 | Temp 98.2°F | Resp 16 | Wt 205.6 lb

## 2022-04-09 DIAGNOSIS — Z006 Encounter for examination for normal comparison and control in clinical research program: Secondary | ICD-10-CM

## 2022-04-09 MED ORDER — STUDY - ESSENCE - OLEZARSEN 50 MG, 80 MG OR PLACEBO SQ INJECTION (PI-HILTY)
80.0000 mg | INJECTION | Freq: Once | SUBCUTANEOUS | Status: AC
  Administered 2022-04-09: 80 mg via SUBCUTANEOUS
  Filled 2022-04-09: qty 0.8

## 2022-04-09 NOTE — Research (Addendum)
Charles [name]. Rollene Rotunda [facility] Week 1 Day 1 09-Apr-2022   Apolipoprotein B48 6.66 mg/dL     [] Clinically Significant  [x] Not Clinically Significant  Pixie Casino, MD, [facility], [place] Director of the Advanced Lipid Disorders &  Cardiovascular Risk Reduction Clinic Diplomate of the American Board of Clinical Lipidology Attending Cardiologist  Direct Dial: [phone]  Fax: [phone]  Website:  [email or web address]     TREATMENT DAY 1 - STUDY WEEK 1    Subject Number: [id number]                  [id number] Randomization Number: [id number] [facility]    [x] Vital Signs Collected - Blood Pressure: 119/74 - Height:6 Ft - Weight:205.6 lbs - Heart Rate:67 - Respiratory Rate:99% - Temperature:98.2 - Oxygen Saturation:99%  [x]  Physical Exam Completed by PI or SUB-I -Dr Lia Foyer   [x]  12-lead ECG  [x]  Extended Urinalysis   [x]  Lab collection per protocol  [x]   (abdominal pain only) since last visit  [x]  Assessment of ER Visits, Hospitalizations, and Inpatient Days  [x]  Adverse Events and Concomitant Medications  [x]  Diet, Lifestyle, and Alcohol Counseling   [x]  Study Drug: [place] Injection  Charles [name] here for week 1 day 1 visit. He reports no abd pain, or other pain. He reports no ED or Urgent care visits, and no changes in his medications. Dr Lia Foyer completed his physical and reviewed the EKG. VS taken at 0910, Blood drawn at 0934, and Urine obtained at 0930.EKG completed at 0920. Injection given in right lower abd at 1010 kit number [id number]. Next appointment scheduled for Aug 29 at 1000.  Current Outpatient Medications:    amLODipine (NORVASC) 5 MG tablet, TAKE ONE TABLET (5 MG DOSE) BY MOUTH DAILY., Disp: 90 tablet, Rfl: 2   Blood Glucose Monitoring Suppl (FREESTYLE LITE) w/Device KIT, Use to check blood sugars 3 times daily, Disp: 1 kit, Rfl: 0   cholecalciferol (VITAMIN D3) 25 MCG (1000 UNIT) tablet, Take 2,000 Units by mouth daily., Disp: ,  Rfl:    fenofibrate (TRICOR) 145 MG tablet, Take 1 tablet (145 mg total) by mouth daily., Disp: 30 tablet, Rfl: 11   glucose blood test strip, Use to test blood sugars 3 times daily, Disp: 300 each, Rfl: 5   icosapent Ethyl (VASCEPA) 1 g capsule, Take 2 capsules (2 g total) by mouth 2 (two) times daily., Disp: 120 capsule, Rfl: 11   insulin glargine-yfgn (SEMGLEE) 100 UNIT/ML Pen, Inject 24 Units into the skin daily for 30 days., Disp: 7.2 mL, Rfl: 0   insulin glargine-yfgn (SEMGLEE, YFGN,) 100 UNIT/ML Pen, Inject 24 Units into the skin daily., Disp: 30 mL, Rfl: 3   insulin lispro (HUMALOG KWIKPEN) 100 UNIT/ML KwikPen, Inject 2-12u into the skin 3x a day with meals per sliding scale. BG=180-200: 2u; BG=201-250: 4u; BG=251-300: 6u; BG=3-1-350: 8u; BG=351-400: 10u; and BG=401-450: 12u, Disp: 30 mL, Rfl: 2   Insulin Pen Needle (PENTIPS) 31G X 5 MM MISC, use as directed to inject insulin up to 4 times per day, Disp: 100 each, Rfl: 11   Insulin Pen Needle (PENTIPS) 31G X 5 MM MISC, Use to inject insulin 4 times a day., Disp: 100 each, Rfl: 2   Lancets (FREESTYLE) lancets, Use as directed to test blood sugars 3 times daily, Disp: 300 each, Rfl: 5   lisinopril (ZESTRIL) 5 MG tablet, Take 1 tablet by mouth daily, Disp: 90 tablet, Rfl: 3   Multiple Vitamins-Minerals (CENTURY PO), Take by mouth.,  Disp: , Rfl:    rosuvastatin (CRESTOR) 20 MG tablet, Take 1 tablet (20 mg total) by mouth daily., Disp: 90 tablet, Rfl: 3   Accu-Chek Softclix Lancets lancets, Use 1 lancet to check blood sugar 3 times a day., Disp: 100 each, Rfl: 1   Blood Glucose Calibration (ACCU-CHEK GUIDE CONTROL) LIQD, Use application once a week., Disp: 1 each, Rfl: 1   Blood Glucose Monitoring Suppl (ACCU-CHEK GUIDE) w/Device KIT, USE METER TO CHECK BLOOD SUGAR THREE TIMES A DAY, Disp: , Rfl:    glucose blood (ACCU-CHEK GUIDE) test strip, Use to test blood sugars 3 times a day, Disp: 300 each, Rfl: 5   metoprolol tartrate (LOPRESSOR) 100 MG  tablet, Take 1 tablet by mouth 2 hours prior to cardiac CT for HR control IF HR >55bpm., Disp: 1 tablet, Rfl: 0

## 2022-04-09 NOTE — Research (Cosign Needed Addendum)
[place] Qualification retest 1 04-April-2022      Apolipoprotein B48  2.90  mg/dL    '[]'$ Clinically Significant  '[x]'$ Not Clinically Significant   Any further action needed to be taken per the PI? No  Pixie Casino, MD, [facility], [place] Director of the Advanced Lipid Disorders &  Cardiovascular Risk Reduction Clinic Diplomate of the American Board of Clinical Lipidology Attending Cardiologist  Direct Dial: [phone]  Fax: [phone]  Website:  [email or web address]         [place] Qualification Retest 1 04-April-2022           This report  has Lipoprotein (a)  Molar 30.4  Triglycerides 263 as noted on previous labs   Not clinically significant  Pixie Casino, MD, [facility], [place] Director of the Advanced Lipid Disorders &  Cardiovascular Risk Reduction Clinic Diplomate of the American Board of Clinical Lipidology Attending Cardiologist  Direct Dial: [phone]  Fax: [phone]  Website:  [email or web address]

## 2022-04-09 NOTE — Progress Notes (Signed)
Patient seen today.  He is doing well.  Please see my prior entry in note of Trina Scronce from initial visit.  He is followed by Dr. Osborne Casco, Dr. Loletha Grayer and Dr. Mali Hilty.  He presented with DKA, and pancreatitis. His recent triglyceride qualifies him for [facility]. He was a screen fail for CORE.  He was scheduled for a coronary CTA, but we are cancelling this given his prior renal insufficiency even though GFR is now 76 cc.  Also, HgbAa1c has normalized.   He denies symptoms and is feeling much better.    BP 119/74.  Lungs clear.  Cor regular. Abd soft.  Ext no edema.  Neuro non focal.    ECG WNL.  Will have initial treatment today.  Reviewed with patient.    He will continue follow up with Dr. Loletha Grayer, Dr. Debara Pickett, and Dr. Enzo Bi D. Lia Foyer MD, [place] Director, [facility]

## 2022-04-09 NOTE — Research (Signed)
After speaking with Dr Lia Foyer about CTa, he feels that Charles [name] should not have a CTa due to Hx of AKI back in March. 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 CTa cancelled.

## 2022-04-09 NOTE — Addendum Note (Signed)
Addended by: Chanda Busing on: 04/09/2022 03:18 PM   Modules accepted: Orders

## 2022-04-11 NOTE — Research (Addendum)
Charles [name] [facility] Week 1 day 1 06-April-2022         Chemistry:  BUN 32     Mg/dL                            '[]'$ Clinically Significant  '[x]'$ Not Clinically Significant  Glucose  126   mg/dL                     '[]'$ Clinically Significant  '[x]'$ Not Clinically Significant Alkaline Phosphatase 39 U/L         '[]'$ Clinically Significant  '[x]'$ Not Clinically Significant Gamma Glutamyl Transfers (GGT) 10 U/L  '[]'$ Clinically Significant  '[x]'$ Not Clinically Significant  Insulin 29.1                                  '[]'$ Clinically Significant  '[x]'$ Not Clinically Significant                  Hematology: Hemoglobin   g/dL           '[]'$ Clinically Significant  '[x]'$ Not Clinically Significant MCH   pg                         '[]'$ Clinically Significant  '[x]'$ Not Clinically Significant MCHC   g/dL                    '[]'$ Clinically Significant  '[x]'$ Not Clinically Significant  Urinalysis: Protein 10 mg/dL                '[]'$ Clinically Significant  '[x]'$ Not Clinically Significant Uric Acid Crystals  present '[]'$ Clinically Significant  '[x]'$ Not Clinically Significant Mucus 1+                            '[]'$ Clinically Significant  '[x]'$ Not Clinically Significant  Urine Chemistry: Protein Creatinine Ratio 264 mg/g            '[]'$ Clinically Significant  '[x]'$ Not Clinically Significant Urine Albumin 9.25 mg/dL                         '[]'$ Clinically Significant  '[x]'$ Not Clinically Significant Albumin Creatinine Ratio 174 mg/g           '[]'$ Clinically Significant  '[x]'$ Not Clinically Significant  Lipids:  Triglyceride   302   mg/dL   '[]'$ Clinically Significant  '[x]'$ Not Clinically Significant   Any further action needed to be taken per the PI?  No  Pixie Casino, MD, [facility], [place] Director of the Advanced Lipid Disorders &  Cardiovascular Risk Reduction Clinic Diplomate of the American Board of Clinical Lipidology Attending Cardiologist  Direct Dial: [phone]  Fax: [phone]  Website:   [email or web address]

## 2022-04-12 ENCOUNTER — Encounter: Payer: Self-pay | Admitting: Cardiovascular Disease

## 2022-04-12 ENCOUNTER — Other Ambulatory Visit (HOSPITAL_BASED_OUTPATIENT_CLINIC_OR_DEPARTMENT_OTHER): Payer: Self-pay

## 2022-04-12 ENCOUNTER — Ambulatory Visit: Payer: 59 | Admitting: Cardiovascular Disease

## 2022-04-12 VITALS — BP 110/78 | HR 85 | Ht 72.0 in | Wt 202.0 lb

## 2022-04-12 DIAGNOSIS — N179 Acute kidney failure, unspecified: Secondary | ICD-10-CM | POA: Diagnosis not present

## 2022-04-12 DIAGNOSIS — E782 Mixed hyperlipidemia: Secondary | ICD-10-CM | POA: Diagnosis not present

## 2022-04-12 DIAGNOSIS — E139 Other specified diabetes mellitus without complications: Secondary | ICD-10-CM

## 2022-04-12 DIAGNOSIS — I10 Essential (primary) hypertension: Secondary | ICD-10-CM | POA: Diagnosis not present

## 2022-04-12 DIAGNOSIS — Z9189 Other specified personal risk factors, not elsewhere classified: Secondary | ICD-10-CM | POA: Diagnosis not present

## 2022-04-12 MED ORDER — INSULIN GLARGINE-YFGN 100 UNIT/ML ~~LOC~~ SOPN
22.0000 [IU] | PEN_INJECTOR | Freq: Every day | SUBCUTANEOUS | 0 refills | Status: DC
  Filled 2022-04-12 – 2022-04-16 (×2): qty 30, 136d supply, fill #0
  Filled 2022-04-19: qty 21, 95d supply, fill #0
  Filled 2022-05-06 – 2022-05-08 (×2): qty 30, 136d supply, fill #0
  Filled 2022-06-22: qty 18, 81d supply, fill #0
  Filled 2022-09-04: qty 12, 54d supply, fill #1

## 2022-04-12 NOTE — Progress Notes (Signed)
Cardiology Office Note:    Date:  04/13/2022   ID:  Charles [name], DOB March 08, 1971, MRN [id number]  PCP:  Haywood Pao, MD   [facility] Providers Cardiologist:  Sanda Klein, MD     Referring MD: Arman Bogus., MD   Chief Complaint  Patient presents with   Hyperlipidemia    History of Present Illness:    Charles [name] is a 51 y.o. male with a hx of longstanding mixed hyperlipidemia and hypertension, recently hospitalized with acute pancreatitis in the setting of severe hypertriglyceridemia.  He had diabetic ketoacidosis and was diagnosed with diabetes mellitus during the same hospitalization (although hemoglobin A1c elevation suggest this is a more chronic problem).  He also has a family history of early onset coronary artery disease (father with myocardial infarction and bypass surgery at age 35).  He is enrolled in a lipid-lowering trial in our clinic and is seeing Dr. Debara Pickett in the lipid clinic. He is also seeing Dr. Carolin Sicks at [facility], for recent problems with acute kidney injury during the episode of pancreatitis.  He has made a remarkable metabolic turnaround.  His most recent lipid profile from 02/21/2022 shows a triglyceride level of only 170, HDL 42, LDL 125.  NMR assessment confirms the suspicion that he has small dense LDL particles (total particle #[id number], LDL size 20.5, small LDL particle #863).  He is currently taking Vascepa and fenofibrate 145 mg daily, in addition to rosuvastatin 20 mg daily  His glycemic control is exceptional, may be even too tight.  His most recent hemoglobin A1c on 04/04/2022 was only 5.5%.  He is currently only on insulin: Semglee 24 units daily and Humalog 5 units with each meal 3 times a day.  He also has a sliding scale additional dose to administer if his blood sugars elevated before meals, but that is never the case.  The highest glucose he has recorded in the last 2 or 3 months has been 128.  He has had a handful of low  glucose levels, never lower than 60.  He feels well.  He is back to work.  He denies dyspnea, dizziness, syncope, palpitations, chest pain or neuropathic symptoms.  He has not had any abdominal pain.  His GAD65 antibodies were markedly positive but he still many factors insulin with a C-peptide of 2.4.  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  Abnormal ECG stress test on 01/17/2022 exercising for over 10 minutes/over 12 METS.  He was hospitalized with acute abdominal pain on 12/08/2021 at [facility].  He was diagnosed with acute pancreatitis.  Lipase was 454, CT showed typical findings of acute pancreatitis, also showed diffuse hepatic steatosis.  There was no evidence of gallstones or pancreatic abscess or pseudocyst.   He had severe metabolic abnormalities.  Peak glucose was 926 and sodium was 164, creatinine 3.65.  He was acidotic with pH 7.045 anion gap of 33 and serum bicarb of 5, beta hydroxybutyrate was greater than 7.5, consistent with diabetic ketoacidosis and severe volume contraction.  On arrival, hemoglobin A1c was 14.6% suggesting at least a few months of diabetes mellitus.  Direct LDL was only 62, but HDL was only 12, total cholesterol was 680 and triglycerides were 2496 (repeat assay 2611).  By the time of discharge his triglycerides had decreased to [phone] range.  Plasmapheresis was considered but deemed unnecessary.  Labs from his primary care physician and recent hospitalization from:  2019  triglycerides 375, total cholesterol 189, HDL 37,  LDL 77 and fasting glucose 109 2020  triglycerides 284, total cholesterol 205, HDL 36, LDL 112, fasting glucose 113 11/03/2020 triglycerides 2364, total cholesterol 300, HDL 11, fasting glucose 233.  Same lab assay his sodium was low at 121.  He was advised to go to the emergency department but he was asymptomatic and declined. 12/08/2021 triglycerides 2496, total cholesterol 680, HDL 12, direct LDL 62 02/21/2022 triglycerides 170,  total cholesterol 197, HDL 42, LDL 125  Charles [name] has been aware of hypertriglyceridemia for about 30 years, since he was a young adult.  He has been prescribed statins repeatedly, but these have consistently led to abnormalities in his liver function tests and have been stopped, followed by normalization of the liver test abnormalities.   He does not have a history of CAD or PAD.  I do not think he is ever had a formal evaluation for such either.  During his hospitalization with acute pancreatitis an abdominal CT was performed that did not mention any evidence of atherosclerosis in the aorta or the visceral branches.  Additional medical problems include essential hypertension which has generally been well treated and clonic hemifacial spasm (left blepharospasm) which has been frustratingly difficult to treat.  Cardiac medications have been stopped for periods of time to see if this will lead to improvement in blepharospasm, without significant change.  Charles [name]'s father had his first myocardial infarction at age 38 and had bypass surgery shortly thereafter.  He did smoke.  He died at age 12.  [place] mother has diabetes mellitus and had a stroke in her late 73s.  Past Medical History:  Diagnosis Date   Diabetes ([place])    Hyperlipidemia    Hypertension     Past Surgical History:  Procedure Laterality Date   MASS EXCISION  06/17/2012   Procedure: MINOR EXCISION OF MASS;  Surgeon: Haywood Lasso, MD;  Location: [place];  Service: General;  Laterality: N/A;  Removal Cyst of Right Upper Back    Current Medications: Current Meds  Medication Sig   amLODipine (NORVASC) 5 MG tablet TAKE ONE TABLET (5 MG DOSE) BY MOUTH DAILY.   Blood Glucose Monitoring Suppl (FREESTYLE LITE) w/Device KIT Use to check blood sugars 3 times daily   cholecalciferol (VITAMIN D3) 25 MCG (1000 UNIT) tablet Take 2,000 Units by mouth daily.   fenofibrate (TRICOR) 145 MG tablet Take 1 tablet (145 mg total)  by mouth daily.   glucose blood test strip Use to test blood sugars 3 times daily   icosapent Ethyl (VASCEPA) 1 g capsule Take 2 capsules (2 g total) by mouth 2 (two) times daily.   insulin lispro (HUMALOG KWIKPEN) 100 UNIT/ML KwikPen Inject 2-12u into the skin 3x a day with meals per sliding scale. BG=180-200: 2u; BG=201-250: 4u; BG=251-300: 6u; BG=3-1-350: 8u; BG=351-400: 10u; and BG=401-450: 12u   Insulin Pen Needle (PENTIPS) 31G X 5 MM MISC use as directed to inject insulin up to 4 times per day   Insulin Pen Needle (PENTIPS) 31G X 5 MM MISC Use to inject insulin 4 times a day.   Lancets (FREESTYLE) lancets Use as directed to test blood sugars 3 times daily   lisinopril (ZESTRIL) 5 MG tablet Take 1 tablet by mouth daily   Multiple Vitamins-Minerals (CENTURY PO) Take by mouth.   rosuvastatin (CRESTOR) 20 MG tablet Take 1 tablet (20 mg total) by mouth daily.   [DISCONTINUED] insulin glargine-yfgn (SEMGLEE) 100 UNIT/ML Pen Inject 24 Units into the skin daily for 30 days.   [  DISCONTINUED] insulin glargine-yfgn (SEMGLEE, YFGN,) 100 UNIT/ML Pen Inject 24 Units into the skin daily.     Allergies:   Patient has no known allergies.   Social History   Socioeconomic History   Marital status: Married    Spouse name: Charles [name]   Number of children: Not on file   Years of education: Not on file   Highest education level: Not on file  Occupational History   Not on file  Tobacco Use   Smoking status: Never   Smokeless tobacco: Never  Vaping Use   Vaping Use: Never used  Substance and Sexual Activity   Alcohol use: Yes   Drug use: No   Sexual activity: Not on file  Other Topics Concern   Not on file  Social History Narrative   Pt's wife is an Therapist, sports in the [employer] system.   Social Determinants of Health   Financial Resource Strain: Not on file  Food Insecurity: No Food Insecurity (01/11/2022)   Hunger Vital Sign    Worried About Running Out of Food in the Last Year: Never true    Ran Out  of Food in the Last Year: Never true  Transportation Needs: No Transportation Needs (01/11/2022)   PRAPARE - Hydrologist (Medical): No    Lack of Transportation (Non-Medical): No  Physical Activity: Not on file  Stress: Not on file  Social Connections: Not on file     Family History: The patient's family history includes Cancer in his mother; Heart disease in his father; Lymphoma in his mother.  ROS:   Please see the history of present illness.     All other systems reviewed and are negative.  EKGs/Labs/Other Studies Reviewed:    The following studies were reviewed today: Extensive review of notes, labs, imaging studies in care everywhere from his recent hospitalization and previous visits with his primary care provider over the last few years.  EKG:  EKG is not ordered today.  The ekg ordered at his previous appointment demonstrates normal sinus rhythm, questionable voltage criteria for LVH (tall R waves in lead aVL and lead I, but otherwise normal voltage and no repolarization abnormalities), no evidence of ischemic changes, QTc 456 ms  Recent Labs: No results found for requested labs within last 365 days.  Discharge labs from [facility] from 12/17/2021 Sodium 139, potassium 3.9, CO2 26, BUN 36, creatinine 2.30, glucose 211 WBC 11.9, hemoglobin 11.4, platelets 236 K  12/26/2021 Creatinine 1.17, hemoglobin 11.3  01/22/2022 hemoglobin A1c 7.2%  04/04/2022 hemoglobin A1c 5.5%  Recent Lipid Panel No results found for: "CHOL", "TRIG", "HDL", "CHOLHDL", "VLDL", "LDLCALC", "LDLDIRECT" 2019  triglycerides 375, total cholesterol 189, HDL 37, LDL 77 and fasting glucose 109 2020  triglycerides 284, total cholesterol 205, HDL 36, LDL 112, fasting glucose 113 11/03/2020 triglycerides 2364, total cholesterol 300, HDL 11, fasting glucose 233.  Same lab assay his sodium was low at 121.  I am not sure if these labs were fasting, but suspect so. He was advised to go to  the emergency department but he was asymptomatic and declined. 12/08/2021 triglycerides 2496, total cholesterol 680, HDL 12, direct LDL 62 02/21/2022 triglycerides 170, cholesterol 197, HDL 42, LDL 125  Risk Assessment/Calculations:           Physical Exam:    VS:  BP 110/78 (BP Location: Left Arm, Patient Position: Sitting, Cuff Size: Large)   Pulse 85   Ht 6' (1.829 m)   Wt 202 lb (91.6  kg)   SpO2 96%   BMI 27.40 kg/m     Wt Readings from Last 3 Encounters:  04/12/22 202 lb (91.6 kg)  04/09/22 205 lb 9.6 oz (93.3 kg)  03/08/22 206 lb 8 oz (93.7 kg)      General: Alert, oriented x3, no distress, mildly overweight Head: no evidence of trauma, PERRL, EOMI, no exophtalmos or lid lag, no myxedema, no xanthelasma; normal ears, nose and oropharynx Neck: normal jugular venous pulsations and no hepatojugular reflux; brisk carotid pulses without delay and no carotid bruits Chest: clear to auscultation, no signs of consolidation by percussion or palpation, normal fremitus, symmetrical and full respiratory excursions Cardiovascular: normal position and quality of the apical impulse, regular rhythm, normal first and second heart sounds, no murmurs, rubs or gallops Abdomen: no tenderness or distention, no masses by palpation, no abnormal pulsatility or arterial bruits, normal bowel sounds, no hepatosplenomegaly Extremities: no clubbing, cyanosis or edema; 2+ radial, ulnar and brachial pulses bilaterally; 2+ right femoral, posterior tibial and dorsalis pedis pulses; 2+ left femoral, posterior tibial and dorsalis pedis pulses; no subclavian or femoral bruits Neurological: grossly nonfocal Psych: Normal mood and affect   ASSESSMENT:    1. Mixed hyperlipidemia   2. LADA (latent autoimmune diabetes in adults), managed as type 1 ([place])   3. AKI (acute kidney injury) ([place])   4. Essential hypertension   5. At high risk for coronary artery disease     PLAN:    In order of problems listed  above:  Mixed hyperlipidemia: Charles [name] has longstanding evidence of an insulin resistance/highly atherogenic lipid profile.  There's been a remarkable improvement in his triglyceride level, almost in normal range now.  His LDL is higher than desired at 125 (target less than 100).  Seems to be tolerating rosuvastatin so far.  He has had problems with statins which reportedly have caused abnormal LFTs (but he also has clear evidence of hepatic steatosis).  Will defer changes in the statin dose to his appointment with Dr. Debara Pickett in the lipid clinic in a month.  Unfortunately, he is not sure whether his new insurance (which is going to kick in next month) will cover Vascepa.  We will have to cross that bridge if we get there. 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  He may be in a "honeymoon" phase and is probably destined to be on insulin in the future. Currently on Semglee+humalog. Target hemoglobin A1c less than 7%.  Current A1c of 5.5% is probably a little too tight.  We will reduce his Semglee to 22 units at bedtime trying to reduce the likelihood of hypoglycemia events.  We will defer any other changes to his PCP and endocrinologist. AKI: He had normal renal parameters on yearly checks until his admission with DKA and massive volume depletion, very likely had acute tubular necrosis.  Admission creatinine was 3.56, discharge creatinine was 2.2, most recently improved to 1.17.  Nephrology follow-up with Dr. Carolin Sicks.  He is on low-dose lisinopril. HTN: Excellent control on very low-dose lisinopril Increased risk of CAD: Despite numerous coronary risk factors Charles [name] is completely asymptomatic and had a normal ECG treadmill stress test with excellent exercise capacity.  The focus is on risk factor modification.  Imaging studies do not appear to be  necessary at this time.  I think  a calcium score will be very difficult to interpret in view of his frequent attempts at treatment of his hyperlipidemia in the past.  His kidney function has improved substantially and if necessary, we can perform coronary CT angiography or invasive angiography in the future, if symptoms develop.    Medication Adjustments/Labs and Tests Ordered: Current medicines are reviewed at length with the patient today.  Concerns regarding medicines are outlined above.  No orders of the defined types were placed in this encounter.  Meds ordered this encounter  Medications   insulin glargine-yfgn (SEMGLEE, YFGN,) 100 UNIT/ML Pen    Sig: Inject 22 Units into the skin daily.    Dispense:  30 mL    Refill:  0    Patient Instructions  Medication Instructions:  DECREASE the Semglee to 22 units daily  *If you need a refill on your cardiac medications before your next appointment, please call your pharmacy*   Lab Work: None ordered If you have labs (blood work) drawn today and your tests are completely normal, you will receive your results only by: [place] (if you have MyChart) OR A paper copy in the mail If you have any lab test that is abnormal or we need to change your treatment, we will call you to review the results.   Testing/Procedures: None ordered   Follow-Up: At [facility], you and your health needs are our priority.  As part of our continuing mission to provide you with exceptional heart care, we have created designated Provider Care Teams.  These Care Teams include your primary Cardiologist (physician) and Advanced Practice Providers (APPs -  Physician Assistants and Nurse Practitioners) who all work together to provide you with the care you need, when you need it.  We recommend signing up for the patient portal called "MyChart".  Sign up information is provided on this After Visit Summary.  MyChart is used to connect with patients for  Virtual Visits (Telemedicine).  Patients are able to view lab/test results, encounter notes, upcoming appointments, etc.  Non-urgent messages can be sent to your provider as well.   To learn more about what you can do with MyChart, go to [email or web address].    Your next appointment:   6 month(s)  The format for your next appointment:   In Person  Provider:   Sanda Klein, MD {   Important Information About Sugar         Signed, Sanda Klein, MD  04/13/2022 8:45 PM    [place]

## 2022-04-12 NOTE — Patient Instructions (Signed)
Medication Instructions:  DECREASE the Semglee to 22 units daily  *If you need a refill on your cardiac medications before your next appointment, please call your pharmacy*   Lab Work: None ordered If you have labs (blood work) drawn today and your tests are completely normal, you will receive your results only by: [place] (if you have MyChart) OR A paper copy in the mail If you have any lab test that is abnormal or we need to change your treatment, we will call you to review the results.   Testing/Procedures: None ordered   Follow-Up: At [facility], you and your health needs are our priority.  As part of our continuing mission to provide you with exceptional heart care, we have created designated Provider Care Teams.  These Care Teams include your primary Cardiologist (physician) and Advanced Practice Providers (APPs -  Physician Assistants and Nurse Practitioners) who all work together to provide you with the care you need, when you need it.  We recommend signing up for the patient portal called "MyChart".  Sign up information is provided on this After Visit Summary.  MyChart is used to connect with patients for Virtual Visits (Telemedicine).  Patients are able to view lab/test results, encounter notes, upcoming appointments, etc.  Non-urgent messages can be sent to your provider as well.   To learn more about what you can do with MyChart, go to [email or web address].    Your next appointment:   6 month(s)  The format for your next appointment:   In Person  Provider:   Sanda Klein, MD {   Important Information About Sugar

## 2022-04-13 ENCOUNTER — Encounter: Payer: Self-pay | Admitting: Cardiovascular Disease

## 2022-04-13 NOTE — Research (Signed)
Charles [name] Week 1 Day 1 09-April-2022

## 2022-04-17 ENCOUNTER — Other Ambulatory Visit (HOSPITAL_BASED_OUTPATIENT_CLINIC_OR_DEPARTMENT_OTHER): Payer: Self-pay

## 2022-04-19 ENCOUNTER — Other Ambulatory Visit (HOSPITAL_BASED_OUTPATIENT_CLINIC_OR_DEPARTMENT_OTHER): Payer: Self-pay

## 2022-04-30 DIAGNOSIS — N182 Chronic kidney disease, stage 2 (mild): Secondary | ICD-10-CM | POA: Diagnosis not present

## 2022-04-30 DIAGNOSIS — E1122 Type 2 diabetes mellitus with diabetic chronic kidney disease: Secondary | ICD-10-CM | POA: Diagnosis not present

## 2022-04-30 DIAGNOSIS — I10 Essential (primary) hypertension: Secondary | ICD-10-CM | POA: Diagnosis not present

## 2022-04-30 DIAGNOSIS — E118 Type 2 diabetes mellitus with unspecified complications: Secondary | ICD-10-CM | POA: Diagnosis not present

## 2022-04-30 DIAGNOSIS — E1110 Type 2 diabetes mellitus with ketoacidosis without coma: Secondary | ICD-10-CM | POA: Diagnosis not present

## 2022-04-30 DIAGNOSIS — R809 Proteinuria, unspecified: Secondary | ICD-10-CM | POA: Diagnosis not present

## 2022-04-30 DIAGNOSIS — E663 Overweight: Secondary | ICD-10-CM | POA: Diagnosis not present

## 2022-04-30 DIAGNOSIS — E7800 Pure hypercholesterolemia, unspecified: Secondary | ICD-10-CM | POA: Diagnosis not present

## 2022-05-07 ENCOUNTER — Other Ambulatory Visit (HOSPITAL_BASED_OUTPATIENT_CLINIC_OR_DEPARTMENT_OTHER): Payer: Self-pay

## 2022-05-07 ENCOUNTER — Encounter: Payer: Self-pay | Admitting: *Deleted

## 2022-05-07 DIAGNOSIS — Z006 Encounter for examination for normal comparison and control in clinical research program: Secondary | ICD-10-CM

## 2022-05-07 NOTE — Research (Signed)
Spoke with Charles [name] to remind him of his appointment tomorrow at 26, gave parking code, and reminded to be NPO. Voices understanding.

## 2022-05-08 ENCOUNTER — Other Ambulatory Visit (HOSPITAL_BASED_OUTPATIENT_CLINIC_OR_DEPARTMENT_OTHER): Payer: Self-pay

## 2022-05-08 ENCOUNTER — Encounter: Payer: 59 | Admitting: *Deleted

## 2022-05-08 ENCOUNTER — Other Ambulatory Visit: Payer: Self-pay

## 2022-05-08 VITALS — BP 113/75 | HR 71 | Temp 97.6°F | Resp 16

## 2022-05-08 DIAGNOSIS — Z006 Encounter for examination for normal comparison and control in clinical research program: Secondary | ICD-10-CM

## 2022-05-08 MED ORDER — STUDY - ESSENCE - OLEZARSEN 50 MG, 80 MG OR PLACEBO SQ INJECTION (PI-HILTY)
80.0000 mg | INJECTION | Freq: Once | SUBCUTANEOUS | Status: AC
  Administered 2022-05-08: 80 mg via SUBCUTANEOUS
  Filled 2022-05-08: qty 0.8

## 2022-05-08 NOTE — Research (Addendum)
     TREATMENT DAY 29 - STUDY WEEK 5    Subject Number: [id number]            Randomization [id number]         Date:08-May-2022      [x] Vital Signs Collected - Blood Pressure: 113/75 - Heart Rate:71 - Respiratory Rate:16 - Temperature:97.6 - Oxygen Saturation:97%   [x]  Extended Urinalysis   [x]  Lab collection per protocol  [x]   (abdominal pain only) since last visit  [x]  Assessment of ER Visits, Hospitalizations, and Inpatient Days  [x]  Adverse Events and Concomitant Medications  [x]  Diet, Lifestyle, and Alcohol Counseling   [x]  Study Drug: [place] Injection    Mr Charles [name] here for [facility] visit Week 5 day 29. He reports no Ed or Urgent care visits since last visit and No abd pain. He reports his last A1c was 5.2 and the Dr Decreased his insulin to Semglee 20 units, and Hunalog to 5 units with meals.  VS taken at 0959, Blood work drawn at 1007, and Urine obtained at 1011.  Injection given in right lower abd at 1030. Kit number [id number].  Next visit scheduled for Sept 19 at 1000.    Current Outpatient Medications:    amLODipine (NORVASC) 5 MG tablet, TAKE ONE TABLET (5 MG DOSE) BY MOUTH DAILY., Disp: 90 tablet, Rfl: 2   cholecalciferol (VITAMIN D3) 25 MCG (1000 UNIT) tablet, Take 2,000 Units by mouth daily., Disp: , Rfl:    fenofibrate (TRICOR) 145 MG tablet, Take 1 tablet (145 mg total) by mouth daily., Disp: 30 tablet, Rfl: 11   icosapent Ethyl (VASCEPA) 1 g capsule, Take 2 capsules (2 g total) by mouth 2 (two) times daily., Disp: 120 capsule, Rfl: 11   insulin glargine-yfgn (SEMGLEE, YFGN,) 100 UNIT/ML Pen, Inject 22 Units into the skin daily., Disp: 30 mL, Rfl: 0   insulin lispro (HUMALOG KWIKPEN) 100 UNIT/ML KwikPen, Inject 2-12u into the skin 3x a day with meals per sliding scale. BG=180-200: 2u; BG=201-250: 4u; BG=251-300: 6u; BG=3-1-350: 8u; BG=351-400: 10u; and BG=401-450: 12u, Disp: 30 mL, Rfl: 2   lisinopril (ZESTRIL) 5 MG tablet, Take 1 tablet by mouth daily, Disp: 90  tablet, Rfl: 3   Multiple Vitamins-Minerals (CENTURY PO), Take by mouth., Disp: , Rfl:    rosuvastatin (CRESTOR) 20 MG tablet, Take 1 tablet (20 mg total) by mouth daily., Disp: 90 tablet, Rfl: 3   Blood Glucose Monitoring Suppl (FREESTYLE LITE) w/Device KIT, Use to check blood sugars 3 times daily, Disp: 1 kit, Rfl: 0   glucose blood test strip, Use to test blood sugars 3 times daily, Disp: 300 each, Rfl: 5   Insulin Pen Needle (PENTIPS) 31G X 5 MM MISC, use as directed to inject insulin up to 4 times per day, Disp: 100 each, Rfl: 11   Insulin Pen Needle (PENTIPS) 31G X 5 MM MISC, Use to inject insulin 4 times a day., Disp: 100 each, Rfl: 2   Lancets (FREESTYLE) lancets, Use as directed to test blood sugars 3 times daily, Disp: 300 each, Rfl: 5

## 2022-05-10 ENCOUNTER — Emergency Department (HOSPITAL_BASED_OUTPATIENT_CLINIC_OR_DEPARTMENT_OTHER)
Admission: EM | Admit: 2022-05-10 | Discharge: 2022-05-10 | Disposition: A | Discharge status: Home / Self Care | Payer: 59 | Attending: Emergency Medicine | Admitting: Emergency Medicine

## 2022-05-10 ENCOUNTER — Other Ambulatory Visit: Payer: Self-pay

## 2022-05-10 ENCOUNTER — Encounter: Payer: Self-pay | Admitting: *Deleted

## 2022-05-10 ENCOUNTER — Telehealth: Payer: Self-pay | Admitting: Internal Medicine

## 2022-05-10 ENCOUNTER — Encounter (HOSPITAL_BASED_OUTPATIENT_CLINIC_OR_DEPARTMENT_OTHER): Payer: Self-pay

## 2022-05-10 DIAGNOSIS — Z794 Long term (current) use of insulin: Secondary | ICD-10-CM | POA: Diagnosis not present

## 2022-05-10 DIAGNOSIS — E119 Type 2 diabetes mellitus without complications: Secondary | ICD-10-CM | POA: Diagnosis not present

## 2022-05-10 DIAGNOSIS — I10 Essential (primary) hypertension: Secondary | ICD-10-CM | POA: Insufficient documentation

## 2022-05-10 DIAGNOSIS — R748 Abnormal levels of other serum enzymes: Secondary | ICD-10-CM | POA: Diagnosis not present

## 2022-05-10 DIAGNOSIS — M6282 Rhabdomyolysis: Secondary | ICD-10-CM | POA: Insufficient documentation

## 2022-05-10 DIAGNOSIS — Z79899 Other long term (current) drug therapy: Secondary | ICD-10-CM | POA: Diagnosis not present

## 2022-05-10 DIAGNOSIS — R7989 Other specified abnormal findings of blood chemistry: Secondary | ICD-10-CM | POA: Diagnosis not present

## 2022-05-10 DIAGNOSIS — Z006 Encounter for examination for normal comparison and control in clinical research program: Secondary | ICD-10-CM

## 2022-05-10 LAB — COMPREHENSIVE METABOLIC PANEL
ALT: 64 U/L — ABNORMAL HIGH (ref 0–44)
AST: 127 U/L — ABNORMAL HIGH (ref 15–41)
Albumin: 4.2 g/dL (ref 3.5–5.0)
Alkaline Phosphatase: 41 U/L (ref 38–126)
Anion gap: 9 (ref 5–15)
BUN: 35 mg/dL — ABNORMAL HIGH (ref 6–20)
CO2: 23 mmol/L (ref 22–32)
Calcium: 9.6 mg/dL (ref 8.9–10.3)
Chloride: 107 mmol/L (ref 98–111)
Creatinine, Ser: 1.37 mg/dL — ABNORMAL HIGH (ref 0.61–1.24)
GFR, Estimated: >60 mL/min (ref >60)
Glucose, Bld: 143 mg/dL — ABNORMAL HIGH (ref 70–99)
Potassium: 4.3 mmol/L (ref 3.5–5.1)
Sodium: 139 mmol/L (ref 135–145)
Total Bilirubin: 0.6 mg/dL (ref 0.3–1.2)
Total Protein: 8.2 g/dL — ABNORMAL HIGH (ref 6.5–8.1)

## 2022-05-10 LAB — LIPASE, BLOOD: Lipase: 49 U/L (ref 11–51)

## 2022-05-10 LAB — URINALYSIS, ROUTINE W REFLEX MICROSCOPIC
Bilirubin Urine: NEGATIVE
Glucose, UA: NEGATIVE mg/dL
Hgb urine dipstick: NEGATIVE
Ketones, ur: NEGATIVE mg/dL
Leukocytes,Ua: NEGATIVE
Nitrite: NEGATIVE
Protein, ur: NEGATIVE mg/dL
Specific Gravity, Urine: 1.015 (ref 1.005–1.030)
pH: 6 (ref 5.0–8.0)

## 2022-05-10 LAB — CK: Total CK: 3626 U/L — ABNORMAL HIGH (ref 49–397)

## 2022-05-10 LAB — CBC WITH DIFFERENTIAL/PLATELET
Abs Immature Granulocytes: 0.04 10*3/uL (ref 0.00–0.07)
Basophils Absolute: 0.1 10*3/uL (ref 0.0–0.1)
Basophils Relative: 1 %
Eosinophils Absolute: 0.1 10*3/uL (ref 0.0–0.5)
Eosinophils Relative: 2 %
HCT: 44.4 % (ref 39.0–52.0)
Hemoglobin: 14.2 g/dL (ref 13.0–17.0)
Immature Granulocytes: 1 %
Lymphocytes Relative: 32 %
Lymphs Abs: 2.3 10*3/uL (ref 0.7–4.0)
MCH: 26.8 pg (ref 26.0–34.0)
MCHC: 32 g/dL (ref 30.0–36.0)
MCV: 83.8 fL (ref 80.0–100.0)
Monocytes Absolute: 0.6 10*3/uL (ref 0.1–1.0)
Monocytes Relative: 9 %
Neutro Abs: 4.1 10*3/uL (ref 1.7–7.7)
Neutrophils Relative %: 55 %
Platelets: 273 10*3/uL (ref 150–400)
RBC: 5.3 MIL/uL (ref 4.22–5.81)
RDW: 13.2 % (ref 11.5–15.5)
WBC: 7.2 10*3/uL (ref 4.0–10.5)
nRBC: 0 % (ref 0.0–0.2)

## 2022-05-10 MED ORDER — LACTATED RINGERS IV BOLUS
2000.0000 mL | Freq: Once | INTRAVENOUS | Status: AC
  Administered 2022-05-10: 2000 mL via INTRAVENOUS

## 2022-05-10 NOTE — Research (Signed)
Dr Debara Pickett informed of critical labs results- he was notified by [facility] last night. CK and AST/SGOT

## 2022-05-10 NOTE — Research (Addendum)
Charles [name] Essence Week 5 Day 29 08-May-2022         Chemistry: BUN 23      mg/dL                             '[]'$ Clinically Significant  '[x]'$ Not Clinically Significant Glucose  123   mg/dL                       '[]'$ Clinically Significant  '[x]'$ Not Clinically Significant ALT/SGPT 57 U/L                            '[]'$ Clinically Significant  '[x]'$ Not Clinically Significant AST/SGOT 168 U/L                         '[x]'$ Clinically Significant  '[]'$ Not Clinically Significant Alkaline Phosphatase 38 U/L          '[]'$ Clinically Significant  '[x]'$ Not Clinically Significant Creatine Kinase (CK) 8323 U/L    '[x]'$ Clinically Significant  '[]'$ Not Clinically Significant  Hematology: Hemoglobin 13.3  g/dL           '[]'$ Clinically Significant  '[x]'$ Not Clinically Significant MCV 81.4 fl                            '[]'$ Clinically Significant  '[x]'$ Not Clinically Significant    Urine Chemistry: Urine Albumin  5.60 mg/dL                         '[]'$ Clinically Significant  '[x]'$ Not Clinically Significant Protein Creatinine  Ratio 267 mg/g            '[]'$ Clinically Significant  '[x]'$ Not Clinically Significant Albumin Creatinine Ratio 187 mg/g            '[]'$ Clinically Significant  '[x]'$ Not Clinically Significant    CK is Critical high AST/SGOT Potential Monitoring/Stopping Rule   Any further action needed to be taken per the PI?  YES  Will need to pause study. Patient was contacted regarding critical high CK and voicemail was left - the message stated he would not return messages until after 5:30 pm - advise stopping rosuvastatin and fenofibrate - would advise patient present to urgent care or ER for IV fluids given CK>5000 to prevent renal failure. Will need repeat liver enzymes and CK next week.  Instructed the patient to contact the research clinic for further instructions.  Pixie Casino, MD, [facility], [place] Director of the Advanced Lipid Disorders &  Cardiovascular Risk Reduction  Clinic Diplomate of the American Board of Clinical Lipidology Attending Cardiologist  Direct Dial: [phone]  Fax: [phone]  Website:  [email or web address]

## 2022-05-10 NOTE — ED Triage Notes (Signed)
Had labs drawn yesterday, called today by cardiology who states CK was elevated >8,000 & elevated liver enzymes. Denies any symptoms.

## 2022-05-10 NOTE — ED Notes (Signed)
Discharge instructions reviewed with patient. Patient verbalizes understanding, no further questions at this time. Follow up information provided. No acute distress noted at time of departure.  

## 2022-05-10 NOTE — Telephone Encounter (Signed)
Was able to reach Mr. Broxson via cellphone this morning. He is enrolled in a clinical research trial (ESSENCE CS9, for treatment of high triglycerides) and I was notified of an extremely high CK.  Based on this, we have paused participation in the study (however, he was injected yesterday). Instructed him to stop the rosuvastatin and fenofibrate. He was advised to go to [place] for IV fluids given CK>5000 (8323 U/L) and elevated liver enzymes (AST 168, ALT 57), creatinine was normal 1.13, BUN 23.  I have spoken with the research coordinator Beverly Gust, RN, and she will arrange repeat CK and liver enzymes next week and he is actually scheduled to see me on 05/22/2022 in the office for follow-up already.  Pixie Casino, MD, [facility], [place] Director of the Advanced Lipid Disorders &  Cardiovascular Risk Reduction Clinic Diplomate of the American Board of Clinical Lipidology Attending Cardiologist  Direct Dial: [phone]  Fax: [phone]  Website:  [email or web address]

## 2022-05-10 NOTE — ED Provider Notes (Signed)
[facility] EMERGENCY DEPARTMENT Provider Note   CSN: [id number] Arrival date & time: 05/10/22  2330     History  Chief Complaint  Patient presents with   Abnormal Lab    Charles [name] is a 51 y.o. male.with PMH of HTN, HLD, DM who is currently on a clinical research trial (ESSENCE CS9, for treatment of high triglycerides) who was sent here by his cardiologist for an elevated CK level 8323 and mild transaminitis AST 168, ALT 57 with normal creatinine advised to come to the ER for IV fluid rehydration.  Patient is here and generally without complaints.  He feels very well and is tolerating p.o.  He denies any muscle aches or cramping.  He has had no vomiting, no diarrhea, no abdominal pain.  He does note some darker tea colored type urine but otherwise has no complaints. No dysuria or fevers or vomiting.   Abnormal Lab      Home Medications Prior to Admission medications   Medication Sig Start Date End Date Taking? Authorizing Provider  amLODipine (NORVASC) 5 MG tablet TAKE ONE TABLET (5 MG DOSE) BY MOUTH DAILY. 03/06/22 03/06/23  Croitoru, Mihai, MD  Blood Glucose Monitoring Suppl (FREESTYLE LITE) w/Device KIT Use to check blood sugars 3 times daily 02/26/22     cholecalciferol (VITAMIN D3) 25 MCG (1000 UNIT) tablet Take 2,000 Units by mouth daily.    [provider]  fenofibrate (TRICOR) 145 MG tablet Take 1 tablet (145 mg total) by mouth daily. 01/09/22   Croitoru, Mihai, MD  glucose blood test strip Use to test blood sugars 3 times daily 02/26/22     icosapent Ethyl (VASCEPA) 1 g capsule Take 2 capsules (2 g total) by mouth 2 (two) times daily. 01/09/22   Croitoru, Mihai, MD  insulin glargine-yfgn (SEMGLEE, YFGN,) 100 UNIT/ML Pen Inject 22 Units into the skin daily. 04/12/22   Croitoru, Mihai, MD  insulin lispro (HUMALOG KWIKPEN) 100 UNIT/ML KwikPen Inject 2-12u into the skin 3x a day with meals per sliding scale. BG=180-200: 2u; BG=201-250: 4u; BG=251-300: 6u;  BG=3-1-350: 8u; BG=351-400: 10u; and BG=401-450: 12u 02/19/22     Insulin Pen Needle (PENTIPS) 31G X 5 MM MISC use as directed to inject insulin up to 4 times per day 01/22/22     Insulin Pen Needle (PENTIPS) 31G X 5 MM MISC Use to inject insulin 4 times a day. 02/06/22     Lancets (FREESTYLE) lancets Use as directed to test blood sugars 3 times daily 02/26/22     lisinopril (ZESTRIL) 5 MG tablet Take 1 tablet by mouth daily 12/29/21     Multiple Vitamins-Minerals (CENTURY PO) Take by mouth.    [provider]  rosuvastatin (CRESTOR) 20 MG tablet Take 1 tablet (20 mg total) by mouth daily. 02/27/22 02/22/23  Pixie Casino, MD      Allergies    Patient has no known allergies.    Review of Systems   Review of Systems  Physical Exam Updated Vital Signs BP 133/87   Pulse 86   Temp 98.4 F (36.9 C)   Resp 17   Ht 6' (1.829 m)   Wt 91.6 kg   SpO2 95%   BMI 27.40 kg/m  Physical Exam Constitutional: Alert and oriented. Well appearing and in no distress. Eyes: Conjunctivae are normal. ENT      Head: Normocephalic and atraumatic.      Nose: No congestion.      Mouth/Throat: Mucous membranes are moist.  Neck: No stridor. Cardiovascular: S1, S2,  Normal and symmetric distal pulses are present in all extremities.Warm and well perfused. Respiratory: Normal respiratory effort. Breath sounds are normal.  O2 sat 95 on RA. Gastrointestinal: Soft and nontender. There is no CVA tenderness. Musculoskeletal: Normal range of motion in all extremities.      Right lower leg: No tenderness or edema.      Left lower leg: No tenderness or edema. Neurologic: Normal speech and language. No gross focal neurologic deficits are appreciated. Skin: Skin is warm, dry and intact. No rash noted. Psychiatric: Mood and affect are normal. Speech and behavior are normal.  ED Results / Procedures / Treatments   Labs (all labs ordered are listed, but only abnormal results are displayed) Labs Reviewed   COMPREHENSIVE METABOLIC PANEL - Abnormal; Notable for the following components:      Result Value   Glucose, Bld 143 (*)    BUN 35 (*)    Creatinine, Ser 1.37 (*)    Total Protein 8.2 (*)    AST 127 (*)    ALT 64 (*)    All other components within normal limits  CK - Abnormal; Notable for the following components:   Total CK 3,626 (*)    All other components within normal limits  CBC WITH DIFFERENTIAL/PLATELET  LIPASE, BLOOD  URINALYSIS, ROUTINE W REFLEX MICROSCOPIC    EKG None  Radiology No results found.  Procedures Procedures  Remained on constant cardiac monitoring, normal sinus rhythm.  Medications Ordered in ED Medications  lactated ringers bolus 2,000 mL (2,000 mLs Intravenous New Bag/Given 05/10/22 7939)    ED Course/ Medical Decision Making/ A&P                           Medical Decision Making Charles [name] is a 52 y.o. male.with PMH of HTN, HLD, DM who is currently on a clinical research trial (ESSENCE CS9, for treatment of high triglycerides) who was sent here by his cardiologist for an elevated CK level 8323 and mild transaminitis AST 168, ALT 57 with normal creatinine advised to come to the ER for IV fluid rehydration.   Per Dr Debara Pickett, cardiologist, phone message conversation with patient "He was advised to go to [place] for IV fluids given CK>5000 (8323 U/L) and elevated liver enzymes (AST 168, ALT 57), creatinine was normal 1.13, BUN 23."  Patient here and generally asymptomatic other than noting some mild tea colored urine.  I suspect that CK changes are from the trial medication as well as atorvastatin use.  His labs obtained here were actually reassuring, his CK has down trended to 3626.  AST down trended to 127, ALT 64 and stable.  His BUN was slightly elevated at 35 and creatinine slightly elevated 1.37.  Normal sodium 139, normal potassium 4.3.  Mild hyperglycemia 143 but no anion gap and normal bicarbonate, no concern for DKA.  We will  plan to give patient 2 L of IV fluids and advised continued p.o. hydration at home.  He has a follow-up next week with cardiologist for repeat CK and lab check.  Advise he discontinue fenofibrate and atorvastatin at this time.  Strict return precautions discussed.  He is safe for discharge home.  Amount and/or Complexity of Data Reviewed Labs: ordered. Decision-making details documented in ED Course.     Final Clinical Impression(s) / ED Diagnoses Final diagnoses:  Non-traumatic rhabdomyolysis  Elevated CK    Rx / DC  Orders ED Discharge Orders     None         Elgie Congo, MD 05/10/22 1040

## 2022-05-10 NOTE — Discharge Instructions (Addendum)
You were seen in the emergency department today for abnormal lab levels likely related to your medication that you are receiving from cardiology.  It actually was improved today at 3626.  There were also no signs of DKA.  You were given IV fluids.  Make sure you keep yourself hydrated and drink plenty of fluids at home.  Discontinue your atorvastatin and fenofibrate at this time.  Follow-up with your cardiologist for repeat lab check.  Come back if any severe worsening body aches or muscle pain, unable to keep any fluids down, chest pain, shortness of breath, or any other symptoms concerning to you.

## 2022-05-11 ENCOUNTER — Encounter: Payer: Self-pay | Admitting: *Deleted

## 2022-05-11 DIAGNOSIS — Z006 Encounter for examination for normal comparison and control in clinical research program: Secondary | ICD-10-CM

## 2022-05-11 NOTE — Research (Signed)
Spoke with Charles [name] about coming in for lab draw with Essence research. Charles Wilkie states he has an appointment for a lab draw for Dr [facility] office Monday at 1000. Spoke with Dr Debara Pickett and we will draw Charles 82 labs Monday in research. Ordered a CK and C Met. Charles Lack voices understanding.

## 2022-05-14 ENCOUNTER — Other Ambulatory Visit (HOSPITAL_BASED_OUTPATIENT_CLINIC_OR_DEPARTMENT_OTHER): Payer: Self-pay

## 2022-05-14 ENCOUNTER — Encounter: Payer: 59 | Admitting: *Deleted

## 2022-05-14 DIAGNOSIS — R748 Abnormal levels of other serum enzymes: Secondary | ICD-10-CM

## 2022-05-14 DIAGNOSIS — Z006 Encounter for examination for normal comparison and control in clinical research program: Secondary | ICD-10-CM

## 2022-05-14 NOTE — Addendum Note (Signed)
Addended by: Dorthey Sawyer on: 05/14/2022 10:22 AM   Modules accepted: Orders

## 2022-05-14 NOTE — Research (Signed)
Charles [name] here for Blood draw. Cmet and CK

## 2022-05-15 ENCOUNTER — Encounter: Payer: Self-pay | Admitting: *Deleted

## 2022-05-15 ENCOUNTER — Other Ambulatory Visit (HOSPITAL_BASED_OUTPATIENT_CLINIC_OR_DEPARTMENT_OTHER): Payer: Self-pay

## 2022-05-15 DIAGNOSIS — Z006 Encounter for examination for normal comparison and control in clinical research program: Secondary | ICD-10-CM

## 2022-05-15 LAB — COMPREHENSIVE METABOLIC PANEL
ALT: 38 IU/L (ref 0–44)
AST: 31 IU/L (ref 0–40)
Albumin/Globulin Ratio: 1.6 (ref 1.2–2.2)
Albumin: 4.6 g/dL (ref 3.8–4.9)
Alkaline Phosphatase: 50 IU/L (ref 44–121)
BUN/Creatinine Ratio: 22 — ABNORMAL HIGH (ref 9–20)
BUN: 25 mg/dL — ABNORMAL HIGH (ref 6–24)
Bilirubin Total: 0.5 mg/dL (ref 0.0–1.2)
CO2: 22 mmol/L (ref 20–29)
Calcium: 9.5 mg/dL (ref 8.7–10.2)
Chloride: 100 mmol/L (ref 96–106)
Creatinine, Ser: 1.13 mg/dL (ref 0.76–1.27)
Globulin, Total: 2.8 g/dL (ref 1.5–4.5)
Glucose: 110 mg/dL — ABNORMAL HIGH (ref 70–99)
Potassium: 5.1 mmol/L (ref 3.5–5.2)
Sodium: 137 mmol/L (ref 134–144)
Total Protein: 7.4 g/dL (ref 6.0–8.5)
eGFR: 79 mL/min/{1.73_m2} (ref >59)

## 2022-05-15 LAB — CK: Total CK: 528 U/L (ref 41–331)

## 2022-05-15 NOTE — Research (Signed)
Spoke with Mr Charles [name] to see how he is feeling. States he feels good, no problems. Informed him that his Ck levels are coming down. Asked if he has been working out more, he states no he has not. He is scheduled to see Dr Debara Pickett on Sept 5th.

## 2022-05-17 ENCOUNTER — Other Ambulatory Visit (HOSPITAL_BASED_OUTPATIENT_CLINIC_OR_DEPARTMENT_OTHER): Payer: Self-pay

## 2022-05-18 NOTE — Research (Addendum)
Charles [name] Week 5 day 29 Essence  08-May-2022

## 2022-05-22 ENCOUNTER — Encounter (HOSPITAL_BASED_OUTPATIENT_CLINIC_OR_DEPARTMENT_OTHER): Payer: Self-pay | Admitting: Internal Medicine

## 2022-05-22 ENCOUNTER — Ambulatory Visit (HOSPITAL_BASED_OUTPATIENT_CLINIC_OR_DEPARTMENT_OTHER): Payer: BC Managed Care – PPO | Admitting: Internal Medicine

## 2022-05-22 VITALS — BP 125/87 | HR 72 | Ht 72.0 in | Wt 202.5 lb

## 2022-05-22 DIAGNOSIS — K858 Other acute pancreatitis without necrosis or infection: Secondary | ICD-10-CM

## 2022-05-22 DIAGNOSIS — E783 Hyperchylomicronemia: Secondary | ICD-10-CM | POA: Diagnosis not present

## 2022-05-22 DIAGNOSIS — G720 Drug-induced myopathy: Secondary | ICD-10-CM

## 2022-05-22 DIAGNOSIS — T466X5A Adverse effect of antihyperlipidemic and antiarteriosclerotic drugs, initial encounter: Secondary | ICD-10-CM

## 2022-05-22 DIAGNOSIS — R748 Abnormal levels of other serum enzymes: Secondary | ICD-10-CM

## 2022-05-22 NOTE — Patient Instructions (Signed)
Medication Instructions:  Your physician recommends that you continue on your current medications as directed. Please refer to the Current Medication list given to you today.  *If you need a refill on your cardiac medications before your next appointment, please call your pharmacy*  Follow-Up: At [facility], you and your health needs are our priority.  As part of our continuing mission to provide you with exceptional heart care, we have created designated Provider Care Teams.  These Care Teams include your primary Cardiologist (physician) and Advanced Practice Providers (APPs -  Physician Assistants and Nurse Practitioners) who all work together to provide you with the care you need, when you need it.  We recommend signing up for the patient portal called "MyChart".  Sign up information is provided on this After Visit Summary.  MyChart is used to connect with patients for Virtual Visits (Telemedicine).  Patients are able to view lab/test results, encounter notes, upcoming appointments, etc.  Non-urgent messages can be sent to your provider as well.   To learn more about what you can do with MyChart, go to [email or web address].    Your next appointment:   6 month(s)  The format for your next appointment:   In Person  Provider:   Lyman Bishop MD

## 2022-05-22 NOTE — Progress Notes (Signed)
LIPID CLINIC CONSULT NOTE  Chief Complaint:  High triglycerides  Primary Care Physician: Tisovec, Fransico Him, MD  Primary Cardiologist:  Sanda Klein, MD  HPI:  Charles [name] is a 51 y.o. 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 Sallyanne Kuster. This is a pleasant 51 year old male with a history of high triglycerides in the past who recently had acute onset diabetes associated with acute kidney injury and pancreatitis.  Triglycerides were markedly elevated in the thousands.  A lipid profile showed total cholesterol 680, triglycerides 2496, HDL of 12 and LDL 62.  He had previously been on fenofibrate and remotely had been on a statin but that was discontinued by his PCP.  He recently was seen by Dr. Sallyanne Kuster in follow-up and started on Vascepa.  He has not been on that medicine for a week without any issues.  He does report heart disease in the family including his father who had CABG in his 27s and had repeat bypass surgery in his 105s.  He is not certain if he had a history of high triglycerides but I suspect this is the case.  He also has 3 children and would like genetic screening today to try to evaluate their risk.  He reports changes in his diet recently to reduce carbohydrates but is not necessarily focused on lower fat.  05/22/2022  Charles [name] returns today for follow-up.  He has subsequently enrolled in the ESSENCE clinical trial evaluating the RNA-inhibitor to APOC3.  When I last saw him he was placed on statin therapy.  During the research trial, routine lab monitoring indicated a very high CK of 3626.  I contacted him about this and he reported being asymptomatic.  Because of the significance of this, I advised him to present to the urgent care in [facility] for IV hydration.  He was evaluated and treated there with a reduction in his CK and subsequent labs performed about a week ago showed his CK had declined down to 528.  He again remains  asymptomatic without any weakness, muscle aches or change in urination.  I had recommended stopping his statin and fibrate when his CK was elevated.  Would not advise retrial with statins however he might be a candidate to add back a fibrate.  PMHx:  Past Medical History:  Diagnosis Date   Diabetes (Rafael Hernandez)    Hyperlipidemia    Hypertension     Past Surgical History:  Procedure Laterality Date   MASS EXCISION  06/17/2012   Procedure: MINOR EXCISION OF MASS;  Surgeon: Haywood Lasso, MD;  Location: [facility];  Service: General;  Laterality: N/A;  Removal Cyst of Right Upper Back    FAMHx:  Family History  Problem Relation Age of Onset   Lymphoma Mother    Cancer Mother        lymphoma   Heart disease Father     SOCHx:   reports that he has never smoked. He has never used smokeless tobacco. He reports current alcohol use. He reports that he does not use drugs.  ALLERGIES:  Allergies  Allergen Reactions   Statins Other (See Comments)    Myopathies - elevated CK    ROS: Pertinent items noted in HPI and remainder of comprehensive ROS otherwise negative.  HOME MEDS: Current Outpatient Medications on File Prior to Visit  Medication Sig Dispense Refill   amLODipine (NORVASC) 5 MG tablet TAKE ONE TABLET (5 MG  DOSE) BY MOUTH DAILY. 90 tablet 2   Blood Glucose Monitoring Suppl (FREESTYLE LITE) w/Device KIT Use to check blood sugars 3 times daily 1 kit 0   cholecalciferol (VITAMIN D3) 25 MCG (1000 UNIT) tablet Take 2,000 Units by mouth daily.     glucose blood test strip Use to test blood sugars 3 times daily 300 each 5   icosapent Ethyl (VASCEPA) 1 g capsule Take 2 capsules (2 g total) by mouth 2 (two) times daily. 120 capsule 11   insulin glargine-yfgn (SEMGLEE, YFGN,) 100 UNIT/ML Pen Inject 22 Units into the skin daily. 30 mL 0   insulin lispro (HUMALOG KWIKPEN) 100 UNIT/ML KwikPen Inject 2-12u into the skin 3x a day with meals per sliding scale. BG=180-200: 2u;  BG=201-250: 4u; BG=251-300: 6u; BG=3-1-350: 8u; BG=351-400: 10u; and BG=401-450: 12u 30 mL 2   Insulin Pen Needle (PENTIPS) 31G X 5 MM MISC use as directed to inject insulin up to 4 times per day 100 each 11   Insulin Pen Needle (PENTIPS) 31G X 5 MM MISC Use to inject insulin 4 times a day. 100 each 2   Lancets (FREESTYLE) lancets Use as directed to test blood sugars 3 times daily 300 each 5   lisinopril (ZESTRIL) 5 MG tablet Take 1 tablet by mouth daily 90 tablet 3   Multiple Vitamins-Minerals (CENTURY PO) Take by mouth.     No current facility-administered medications on file prior to visit.    LABS/IMAGING: No results found for this or any previous visit (from the past 48 hour(s)). No results found.  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  He was sent to urgent care and hydrated with a marked improvement in his CK and renal function appears normal.  He had similar elevation in his CK while hospitalized at [facility] earlier this year and had acute renal failure.  I suspect there is underlying susceptibility towards myopathy, possibly an undiagnosed muscular dystrophy.  Nonetheless statin should be avoided.  We will reach out to the clinical trial monitors to see if I could institute any additional therapies however I am blinded to his lipids while he is enrolled in the trial.  Plan  follow-up with me in 6 months or sooner as necessary.  Pixie Casino, MD, [facility], [place] Director of the Advanced Lipid Disorders &  Cardiovascular Risk Reduction Clinic Diplomate of the American Board of Clinical Lipidology Attending Cardiologist  Direct Dial: [phone]  Fax: [phone]  Website:  [email or web address] 05/22/2022, 4:56 PM

## 2022-05-24 ENCOUNTER — Other Ambulatory Visit (HOSPITAL_BASED_OUTPATIENT_CLINIC_OR_DEPARTMENT_OTHER): Payer: Self-pay

## 2022-05-24 ENCOUNTER — Telehealth: Payer: Self-pay | Admitting: Internal Medicine

## 2022-05-24 MED ORDER — FENOFIBRATE 48 MG PO TABS
48.0000 mg | ORAL_TABLET | Freq: Every day | ORAL | 3 refills | Status: DC
  Filled 2022-05-24: qty 90, 90d supply, fill #0
  Filled 2022-08-24: qty 90, 90d supply, fill #1
  Filled 2022-11-20: qty 90, 90d supply, fill #2
  Filled 2023-02-26: qty 90, 90d supply, fill #3

## 2022-05-24 NOTE — Telephone Encounter (Signed)
-----   Message from Pixie Casino, MD sent at 05/24/2022  3:10 PM EDT ----- Regarding: RE: research [place] [place], please restart Charles [name] fenofibrate 48 mg daily.  CK will be followed with research labs.  Thanks.  Dr. Lemmie Evens ----- Message ----- From: Dorthey Sawyer, RN Sent: 05/24/2022   2:46 PM EDT To: Pixie Casino, MD Subject: research                                       Margit Hanks 24-May-2022 2:18 PM We have no specific recommendations for clinical care that is unrelated to trial medications. If the local clinical team would like to restart the patient's chronic medications, I agree this seems appropriate.    This was the response from [facility] about restarting the Fibrate on Charles [name]. Thanks, Lisabeth Pick

## 2022-05-24 NOTE — Telephone Encounter (Signed)
Rx(s) sent to pharmacy electronically. MyChart message sent to patient with recommendations from MD

## 2022-05-29 ENCOUNTER — Ambulatory Visit (HOSPITAL_BASED_OUTPATIENT_CLINIC_OR_DEPARTMENT_OTHER): Payer: 59 | Admitting: Internal Medicine

## 2022-05-30 NOTE — Research (Signed)
ICF for Core updated copy given to pt.

## 2022-06-04 ENCOUNTER — Encounter: Payer: Self-pay | Admitting: *Deleted

## 2022-06-04 DIAGNOSIS — Z006 Encounter for examination for normal comparison and control in clinical research program: Secondary | ICD-10-CM

## 2022-06-04 NOTE — Research (Signed)
Spoke to Charles [name] to remind him of his appointment tomorrow at 55, gave parking code and reminded to be NPO.

## 2022-06-05 ENCOUNTER — Other Ambulatory Visit (HOSPITAL_BASED_OUTPATIENT_CLINIC_OR_DEPARTMENT_OTHER): Payer: Self-pay

## 2022-06-05 ENCOUNTER — Encounter: Payer: 59 | Admitting: *Deleted

## 2022-06-05 ENCOUNTER — Other Ambulatory Visit: Payer: Self-pay

## 2022-06-05 VITALS — BP 119/78 | HR 80 | Temp 98.3°F | Resp 15

## 2022-06-05 DIAGNOSIS — Z006 Encounter for examination for normal comparison and control in clinical research program: Secondary | ICD-10-CM

## 2022-06-05 MED ORDER — STUDY - ESSENCE - OLEZARSEN 50 MG, 80 MG OR PLACEBO SQ INJECTION (PI-HILTY)
80.0000 mg | INJECTION | Freq: Once | SUBCUTANEOUS | Status: AC
  Administered 2022-06-05: 80 mg via SUBCUTANEOUS
  Filled 2022-06-05: qty 0.8

## 2022-06-05 NOTE — Research (Addendum)
TREATMENT DAY 57 - STUDY WEEK 9    Subject Number:  [id number]            Randomization Number: [id number]            Date: 19-Sept-2023      [x] Vital Signs Collected - Blood Pressure:119/78 - Heart Rate:80 - Respiratory Rate:15 - Temperature:98.3 - Oxygen Saturation:100%  [x]  Extended Urinalysis   [x]  Lab collection per protocol  [x]   (abdominal pain only) since last visit  [x]  Assessment of ER Visits, Hospitalizations, and Inpatient Days  [x]  Adverse Events and Concomitant Medications  [x]  Diet, Lifestyle, and Alcohol Counseling   [x]  Study Drug: Smiley Injection   Charles [name] here for Essence Week 9 Day 57 visit. He reports no Ed or urgent care visits, no abd pain, and no changes in his meds. Vs taken at 1015, blood drawn at  1019, and urine obtained at 1033. Injection given at 1047 in right lower abd. Kit number [id number]. Scheduled next visit for 07-03-22 at 1000. CK drawn and sent to [facility].  New Essence consent signed at 41. Copy given to pt.    Essence Re- Consent     Subject Name:    Subject met inclusion and exclusion criteria.  The informed consent form, study requirements and expectations were reviewed with the subject and questions and concerns were addressed prior to the signing of the consent form.  The subject verbalized understanding of the trial requirements.  The subject agreed to participate in the Essence  trial and signed the informed consent at 1028 on 06-05-22.  The informed consent was obtained prior to performance of any protocol-specific procedures for the subject.  A copy of the signed informed consent was given to the subject and a copy was placed in the subject's medical record.   Charles [name]  Consent dated 05-24-22   Current Outpatient Medications:    amLODipine (NORVASC) 5 MG tablet, TAKE ONE TABLET (5 MG DOSE) BY MOUTH DAILY., Disp: 90 tablet, Rfl: 2   Blood Glucose Monitoring Suppl (FREESTYLE LITE) w/Device KIT, Use to check blood sugars  3 times daily, Disp: 1 kit, Rfl: 0   cholecalciferol (VITAMIN D3) 25 MCG (1000 UNIT) tablet, Take 2,000 Units by mouth daily., Disp: , Rfl:    fenofibrate (TRICOR) 48 MG tablet, Take 1 tablet (48 mg total) by mouth daily., Disp: 90 tablet, Rfl: 3   glucose blood test strip, Use to test blood sugars 3 times daily, Disp: 300 each, Rfl: 5   icosapent Ethyl (VASCEPA) 1 g capsule, Take 2 capsules (2 g total) by mouth 2 (two) times daily., Disp: 120 capsule, Rfl: 11   insulin glargine-yfgn (SEMGLEE, YFGN,) 100 UNIT/ML Pen, Inject 22 Units into the skin daily., Disp: 30 mL, Rfl: 0   insulin lispro (HUMALOG KWIKPEN) 100 UNIT/ML KwikPen, Inject 2-12u into the skin 3x a day with meals per sliding scale. BG=180-200: 2u; BG=201-250: 4u; BG=251-300: 6u; BG=3-1-350: 8u; BG=351-400: 10u; and BG=401-450: 12u, Disp: 30 mL, Rfl: 2   Insulin Pen Needle (PENTIPS) 31G X 5 MM MISC, use as directed to inject insulin up to 4 times per day, Disp: 100 each, Rfl: 11   Insulin Pen Needle (PENTIPS) 31G X 5 MM MISC, Use to inject insulin 4 times a day., Disp: 100 each, Rfl: 2   Lancets (FREESTYLE) lancets, Use as directed to test blood sugars 3 times daily, Disp: 300 each, Rfl: 5   lisinopril (ZESTRIL) 5 MG tablet, Take 1  tablet by mouth daily, Disp: 90 tablet, Rfl: 3   Multiple Vitamins-Minerals (CENTURY PO), Take by mouth., Disp: , Rfl:   Current Facility-Administered Medications:    Study - ESSENCE - olezarsen 50 mg, 80 mg or placebo SQ injection (PI-Hilty), 80 mg, Subcutaneous, Once, Hilty, Lisette Abu, MD

## 2022-06-06 LAB — CK: Total CK: 272 U/L (ref 41–331)

## 2022-06-07 NOTE — Research (Addendum)
Charles [name] Week 9 day 57 19-Sept-2023         Chemistry: BUN 25 mg/dL                                    '[]'$ Clinically Significant  '[x]'$ Not Clinically Significant  Glucose  128   mg/dL                         '[]'$ Clinically Significant  '[x]'$ Not Clinically Significant Creatine Kinase (CK) 291 U/L            '[]'$ Clinically Significant  '[x]'$ Not Clinically Significant   Hematology: MCV 81.4 fL                             '[]'$ Clinically Significant  '[x]'$ Not Clinically Significant                     Urinalysis: Glucose       mg/dL            '[]'$ Clinically Significant  '[x]'$ Not Clinically Significant  Urine Chemistry: Urine Albumin 7.95 mg/dL                       '[]'$ Clinically Significant  '[x]'$ Not Clinically Significant Protein Creatinine Ratio 301 mg/g          '[]'$ Clinically Significant  '[x]'$ Not Clinically Significant Albumin Creatinine  Ratio 217 mg/g        '[]'$ Clinically Significant  '[x]'$ Not Clinically Significant    Any further action needed to be taken per the PI?  No  Pixie Casino, MD, [facility], [place] Director of the Advanced Lipid Disorders &  Cardiovascular Risk Reduction Clinic Diplomate of the American Board of Clinical Lipidology Attending Cardiologist  Direct Dial: [phone]  Fax: [phone]  Website:  [email or web address]

## 2022-06-12 ENCOUNTER — Other Ambulatory Visit (HOSPITAL_BASED_OUTPATIENT_CLINIC_OR_DEPARTMENT_OTHER): Payer: Self-pay

## 2022-06-14 NOTE — Research (Addendum)
Charles [name] Week 9 Day 42 19-Sept-2023

## 2022-06-22 ENCOUNTER — Other Ambulatory Visit (HOSPITAL_COMMUNITY): Payer: Self-pay

## 2022-06-22 ENCOUNTER — Other Ambulatory Visit (HOSPITAL_BASED_OUTPATIENT_CLINIC_OR_DEPARTMENT_OTHER): Payer: Self-pay

## 2022-06-25 ENCOUNTER — Other Ambulatory Visit (HOSPITAL_BASED_OUTPATIENT_CLINIC_OR_DEPARTMENT_OTHER): Payer: Self-pay

## 2022-06-26 ENCOUNTER — Other Ambulatory Visit (HOSPITAL_BASED_OUTPATIENT_CLINIC_OR_DEPARTMENT_OTHER): Payer: Self-pay

## 2022-06-27 ENCOUNTER — Other Ambulatory Visit (HOSPITAL_BASED_OUTPATIENT_CLINIC_OR_DEPARTMENT_OTHER): Payer: Self-pay

## 2022-06-28 ENCOUNTER — Encounter: Payer: Self-pay | Admitting: *Deleted

## 2022-06-28 ENCOUNTER — Other Ambulatory Visit (HOSPITAL_BASED_OUTPATIENT_CLINIC_OR_DEPARTMENT_OTHER): Payer: Self-pay

## 2022-06-28 DIAGNOSIS — Z006 Encounter for examination for normal comparison and control in clinical research program: Secondary | ICD-10-CM

## 2022-06-28 NOTE — Research (Signed)
To clarify pt is still taking Semglee insulin, never started on Lantus insulin.

## 2022-06-29 MED ORDER — STUDY - ESSENCE - OLEZARSEN 50 MG, 80 MG OR PLACEBO SQ INJECTION (PI-HILTY)
80.0000 mg | INJECTION | SUBCUTANEOUS | Status: DC
  Administered 2022-07-03 – 2022-08-01 (×2): 80 mg via SUBCUTANEOUS
  Filled 2022-06-29 (×2): qty 0.8

## 2022-07-02 ENCOUNTER — Other Ambulatory Visit (HOSPITAL_BASED_OUTPATIENT_CLINIC_OR_DEPARTMENT_OTHER): Payer: Self-pay

## 2022-07-03 ENCOUNTER — Encounter: Payer: BC Managed Care – PPO | Admitting: *Deleted

## 2022-07-03 ENCOUNTER — Other Ambulatory Visit: Payer: Self-pay

## 2022-07-03 ENCOUNTER — Other Ambulatory Visit (HOSPITAL_BASED_OUTPATIENT_CLINIC_OR_DEPARTMENT_OTHER): Payer: Self-pay

## 2022-07-03 VITALS — BP 121/83 | HR 78 | Temp 98.2°F | Resp 16

## 2022-07-03 DIAGNOSIS — Z006 Encounter for examination for normal comparison and control in clinical research program: Secondary | ICD-10-CM

## 2022-07-03 NOTE — Research (Addendum)
     TREATMENT DAY 85 - STUDY WEEK 13    Subject Number: [id number]              Randomization [id number]            Date: 03-Jul-2022      [x] Vital Signs Collected - Blood Pressure:121/83 - Heart Rate:78 - Respiratory Rate:16 - Temperature:98.2 - Oxygen Saturation:100%   [x]  Extended Urinalysis   [x]  Lab collection per protocol  [x]   (abdominal pain only) since last visit  [x]  Assessment of ER Visits, Hospitalizations, and Inpatient Days  [x]  Adverse Events and Concomitant Medications  [x]  Diet, Lifestyle, and Alcohol Counseling   [x]  Study Drug: [place] Injection   Charles [name] is here for Week 13 Day 85 of Essence. He reports no visits to the ED or Urgent care since last visit, No abd pain. No changes in medications. VS taken at 1017, Blood drawn at 1016, and urine Obtained at 1028. Injection given in right lower abd at 1100.  Kit number [id number].Tol well. Next visit scheduled  Nov 15 at 1000.   Current Outpatient Medications:    amLODipine (NORVASC) 5 MG tablet, TAKE ONE TABLET (5 MG DOSE) BY MOUTH DAILY., Disp: 90 tablet, Rfl: 2   cholecalciferol (VITAMIN D3) 25 MCG (1000 UNIT) tablet, Take 2,000 Units by mouth daily., Disp: , Rfl:    fenofibrate (TRICOR) 48 MG tablet, Take 1 tablet (48 mg total) by mouth daily., Disp: 90 tablet, Rfl: 3   glucose blood test strip, Use to test blood sugars 3 times daily, Disp: 300 each, Rfl: 5   icosapent Ethyl (VASCEPA) 1 g capsule, Take 2 capsules (2 g total) by mouth 2 (two) times daily., Disp: 120 capsule, Rfl: 11   insulin glargine-yfgn (SEMGLEE, YFGN,) 100 UNIT/ML Pen, Inject 22 Units into the skin daily., Disp: 30 mL, Rfl: 0   insulin lispro (HUMALOG KWIKPEN) 100 UNIT/ML KwikPen, Inject 2-12u into the skin 3x a day with meals per sliding scale. BG=180-200: 2u; BG=201-250: 4u; BG=251-300: 6u; BG=3-1-350: 8u; BG=351-400: 10u; and BG=401-450: 12u, Disp: 30 mL, Rfl: 2   Insulin Pen Needle (PENTIPS) 31G X 5 MM MISC, use as directed to inject  insulin up to 4 times per day, Disp: 100 each, Rfl: 11   Insulin Pen Needle (PENTIPS) 31G X 5 MM MISC, Use to inject insulin 4 times a day., Disp: 100 each, Rfl: 2   Lancets (FREESTYLE) lancets, Use as directed to test blood sugars 3 times daily, Disp: 300 each, Rfl: 5   lisinopril (ZESTRIL) 5 MG tablet, Take 1 tablet by mouth daily, Disp: 90 tablet, Rfl: 3   Multiple Vitamins-Minerals (CENTURY PO), Take by mouth., Disp: , Rfl:    Blood Glucose Monitoring Suppl (FREESTYLE LITE) w/Device KIT, Use to check blood sugars 3 times daily, Disp: 1 kit, Rfl: 0  Current Facility-Administered Medications:    Study - ESSENCE - olezarsen 50 mg, 80 mg or placebo SQ injection (PI-Hilty), 80 mg, Subcutaneous, Q28 days, Hilty, Lisette Abu, MD

## 2022-07-04 ENCOUNTER — Other Ambulatory Visit (HOSPITAL_BASED_OUTPATIENT_CLINIC_OR_DEPARTMENT_OTHER): Payer: Self-pay

## 2022-07-05 ENCOUNTER — Other Ambulatory Visit (HOSPITAL_BASED_OUTPATIENT_CLINIC_OR_DEPARTMENT_OTHER): Payer: Self-pay

## 2022-07-05 NOTE — Research (Addendum)
[name] Week 13 Day 85 03-Jul-2022       Chemistry: BUN 29 mg/dL                             '[]'$ Clinically Significant  '[x]'$ Not Clinically Significant  Glucose  116   mg/dL                   '[]'$ Clinically Significant  '[x]'$ Not Clinically Significant Total protein 8.3 g/dL                   '[]'$ Clinically Significant  '[x]'$ Not Clinically Significant Creatine Kinase (CK) 246 U/L      '[]'$ Clinically Significant  '[x]'$ Not Clinically Significant   Hematology: RDW 14.9 %                                       '[]'$ Clinically Significant  '[x]'$ Not Clinically Significant   Urinalysis: Protein 10   mg/dL                                '[]'$ Clinically Significant  '[x]'$ Not Clinically Significant  Urine Chemistry: Urine Albumin 10.90 mg/dL                     '[]'$ Clinically Significant  '[x]'$ Not Clinically Significant Protein Creatinine Ratio 289 mg/g           '[]'$ Clinically Significant  '[x]'$ Not Clinically Significant Albumin Creatinine Ratio 197 mg/g         '[]'$ Clinically Significant  '[x]'$ Not Clinically Significant    Any further action needed to be taken per the PI?  No  CK mildly elevated, but improved compared to previous  Charles Casino, MD, [facility], [place] Director of the Advanced Lipid Disorders &  Cardiovascular Risk Reduction Clinic Diplomate of the American Board of Clinical Lipidology Attending Cardiologist  Direct Dial: [phone]  Fax: [phone]  Website:  [email or web address]

## 2022-07-06 ENCOUNTER — Other Ambulatory Visit (HOSPITAL_BASED_OUTPATIENT_CLINIC_OR_DEPARTMENT_OTHER): Payer: Self-pay

## 2022-07-06 NOTE — Research (Addendum)
Charles [name]. Like Week 13 Day 85 Essence 03-Jul-2022

## 2022-07-09 ENCOUNTER — Encounter: Payer: Self-pay | Admitting: Cardiovascular Disease

## 2022-07-09 ENCOUNTER — Other Ambulatory Visit (HOSPITAL_BASED_OUTPATIENT_CLINIC_OR_DEPARTMENT_OTHER): Payer: Self-pay

## 2022-07-13 ENCOUNTER — Encounter: Payer: Self-pay | Admitting: *Deleted

## 2022-07-13 DIAGNOSIS — Z006 Encounter for examination for normal comparison and control in clinical research program: Secondary | ICD-10-CM

## 2022-07-13 NOTE — Research (Signed)
Received an alert from [employer] ApoB > 20 mg/dL. Spoke with Charles [name] about Diet, Lifestyle and med compliance. Voices understanding. I had left a message for Him Oct 20 at 1112, explaining above. States he feels good, no abd pain.

## 2022-07-16 ENCOUNTER — Other Ambulatory Visit (HOSPITAL_BASED_OUTPATIENT_CLINIC_OR_DEPARTMENT_OTHER): Payer: Self-pay

## 2022-07-20 ENCOUNTER — Other Ambulatory Visit (HOSPITAL_BASED_OUTPATIENT_CLINIC_OR_DEPARTMENT_OTHER): Payer: Self-pay

## 2022-07-31 ENCOUNTER — Encounter: Payer: Self-pay | Admitting: *Deleted

## 2022-07-31 ENCOUNTER — Other Ambulatory Visit (HOSPITAL_BASED_OUTPATIENT_CLINIC_OR_DEPARTMENT_OTHER): Payer: Self-pay

## 2022-07-31 DIAGNOSIS — I129 Hypertensive chronic kidney disease with stage 1 through stage 4 chronic kidney disease, or unspecified chronic kidney disease: Secondary | ICD-10-CM | POA: Diagnosis not present

## 2022-07-31 DIAGNOSIS — E1122 Type 2 diabetes mellitus with diabetic chronic kidney disease: Secondary | ICD-10-CM | POA: Diagnosis not present

## 2022-07-31 DIAGNOSIS — Z006 Encounter for examination for normal comparison and control in clinical research program: Secondary | ICD-10-CM

## 2022-07-31 NOTE — Research (Signed)
Spoke with Charles [name] reminded him of his appointment tomorrow at 1000, gave parking code and reminded to be NPO.

## 2022-08-01 ENCOUNTER — Other Ambulatory Visit: Payer: Self-pay

## 2022-08-01 ENCOUNTER — Encounter: Payer: BC Managed Care – PPO | Admitting: *Deleted

## 2022-08-01 DIAGNOSIS — Z006 Encounter for examination for normal comparison and control in clinical research program: Secondary | ICD-10-CM

## 2022-08-01 MED ORDER — STUDY - ESSENCE - OLEZARSEN 50 MG, 80 MG OR PLACEBO SQ INJECTION (PI-HILTY)
80.0000 mg | INJECTION | SUBCUTANEOUS | Status: DC
  Filled 2022-08-01: qty 0.8

## 2022-08-01 NOTE — Research (Addendum)
     TREATMENT DAY 113 - STUDY WEEK-17    Subject Number: [id number]             Randomization [id number]            Date: 01-Aug-2022      [x] Vital Signs Collected - Blood Pressure: 147/89 - Heart Rate: 85 - Respiratory Rate:16  - Temperature:98.1 - Oxygen Saturation:100%   [x]  Lab collection per protocol  [x]  (abdominal pain only) since last visit  [x]  Assessment of ER Visits, Hospitalizations, and Inpatient Days  [x]  Adverse Events and Concomitant Medications  [x]  Diet, Lifestyle, and Alcohol Counseling   [x]  Study Drug: [place] Injection   Mr Checchi is here for Week 17 Day 113 of Essence research study. He reports no abd pain, no visits to the Ed or urgent care since last visit. VS taken at 1015. Injection given at 1039 into right lower abd. Kit number [id number]. Tol well.  No med changes noted. Next visit scheduled for Dec 13 at 1000.  BP 147/89      HR 95 Temp 98.1      Resp 16 O2 sat 100%    Current Outpatient Medications:    amLODipine (NORVASC) 5 MG tablet, TAKE ONE TABLET (5 MG DOSE) BY MOUTH DAILY., Disp: 90 tablet, Rfl: 2   Blood Glucose Monitoring Suppl (FREESTYLE LITE) w/Device KIT, Use to check blood sugars 3 times daily, Disp: 1 kit, Rfl: 0   cholecalciferol (VITAMIN D3) 25 MCG (1000 UNIT) tablet, Take 2,000 Units by mouth daily., Disp: , Rfl:    fenofibrate (TRICOR) 48 MG tablet, Take 1 tablet (48 mg total) by mouth daily., Disp: 90 tablet, Rfl: 3   glucose blood test strip, Use to test blood sugars 3 times daily, Disp: 300 each, Rfl: 5   icosapent Ethyl (VASCEPA) 1 g capsule, Take 2 capsules (2 g total) by mouth 2 (two) times daily., Disp: 120 capsule, Rfl: 11   insulin glargine-yfgn (SEMGLEE, YFGN,) 100 UNIT/ML Pen, Inject 22 Units into the skin daily., Disp: 30 mL, Rfl: 0   insulin lispro (HUMALOG KWIKPEN) 100 UNIT/ML KwikPen, Inject 2-12u into the skin 3x a day with meals per sliding scale. BG=180-200: 2u; BG=201-250: 4u; BG=251-300: 6u; BG=3-1-350: 8u;  BG=351-400: 10u; and BG=401-450: 12u, Disp: 30 mL, Rfl: 2   Insulin Pen Needle (PENTIPS) 31G X 5 MM MISC, use as directed to inject insulin up to 4 times per day, Disp: 100 each, Rfl: 11   Insulin Pen Needle (PENTIPS) 31G X 5 MM MISC, Use to inject insulin 4 times a day., Disp: 100 each, Rfl: 2   Lancets (FREESTYLE) lancets, Use as directed to test blood sugars 3 times daily, Disp: 300 each, Rfl: 5   lisinopril (ZESTRIL) 5 MG tablet, Take 1 tablet by mouth daily, Disp: 90 tablet, Rfl: 3   Multiple Vitamins-Minerals (CENTURY PO), Take by mouth., Disp: , Rfl:   Current Facility-Administered Medications:    Study - ESSENCE - olezarsen 50 mg, 80 mg or placebo SQ injection (PI-Hilty), 80 mg, Subcutaneous, Q28 days, Hilty, Lisette Abu, MD

## 2022-08-06 ENCOUNTER — Other Ambulatory Visit (HOSPITAL_BASED_OUTPATIENT_CLINIC_OR_DEPARTMENT_OTHER): Payer: Self-pay

## 2022-08-07 ENCOUNTER — Other Ambulatory Visit (HOSPITAL_BASED_OUTPATIENT_CLINIC_OR_DEPARTMENT_OTHER): Payer: Self-pay

## 2022-08-11 ENCOUNTER — Other Ambulatory Visit (HOSPITAL_COMMUNITY): Payer: Self-pay

## 2022-08-13 ENCOUNTER — Other Ambulatory Visit (HOSPITAL_BASED_OUTPATIENT_CLINIC_OR_DEPARTMENT_OTHER): Payer: Self-pay

## 2022-08-16 ENCOUNTER — Other Ambulatory Visit (HOSPITAL_BASED_OUTPATIENT_CLINIC_OR_DEPARTMENT_OTHER): Payer: Self-pay

## 2022-08-28 ENCOUNTER — Other Ambulatory Visit (HOSPITAL_BASED_OUTPATIENT_CLINIC_OR_DEPARTMENT_OTHER): Payer: Self-pay

## 2022-08-28 ENCOUNTER — Encounter: Payer: Self-pay | Admitting: *Deleted

## 2022-08-28 DIAGNOSIS — Z006 Encounter for examination for normal comparison and control in clinical research program: Secondary | ICD-10-CM

## 2022-08-28 NOTE — Research (Signed)
Spoke with Charles [name] informed him of his appointment tomorrow at 1000. Gave parking code. Voices understanding.

## 2022-08-29 ENCOUNTER — Encounter: Payer: BC Managed Care – PPO | Admitting: *Deleted

## 2022-08-29 ENCOUNTER — Other Ambulatory Visit: Payer: Self-pay

## 2022-08-29 DIAGNOSIS — Z006 Encounter for examination for normal comparison and control in clinical research program: Secondary | ICD-10-CM

## 2022-08-29 MED ORDER — STUDY - ESSENCE - OLEZARSEN 50 MG, 80 MG OR PLACEBO SQ INJECTION (PI-HILTY)
80.0000 mg | INJECTION | SUBCUTANEOUS | Status: DC
  Administered 2022-08-29: 80 mg via SUBCUTANEOUS
  Filled 2022-08-29: qty 0.8

## 2022-08-29 NOTE — Research (Addendum)
     TREATMENT DAY 141 - STUDY WEEK 21    Subject Number: [id number]              Randomization [id number] Date: 29-Aug-2022    [x] Vital Signs Collected - Blood Pressure:137/77 - Heart Rate:88 - Respiratory Rate:18 - Temperature:98.3 - Oxygen Saturation:99%    [x]  (abdominal pain only) since last visit  [x]  Assessment of ER Visits, Hospitalizations, and Inpatient Days  [x]  Adverse Events and Concomitant Medications  [x]  Diet, Lifestyle, and Alcohol Counseling   [x]  Study Drug: [place] Injection   Mr Arntz is here for Week 21 Day 141. He reports no abd pain, no visits to the Ed or Urgent care. No changes in his medications. VS taken at 1000, injection given in right lower abd at 1035.  Kit number [id number]. Tol well. Next visit scheduled for Jan 10 at 1000.

## 2022-09-04 ENCOUNTER — Encounter: Payer: Self-pay | Admitting: Cardiovascular Disease

## 2022-09-04 ENCOUNTER — Other Ambulatory Visit (HOSPITAL_BASED_OUTPATIENT_CLINIC_OR_DEPARTMENT_OTHER): Payer: Self-pay

## 2022-09-20 DIAGNOSIS — L720 Epidermal cyst: Secondary | ICD-10-CM | POA: Diagnosis not present

## 2022-09-20 DIAGNOSIS — D485 Neoplasm of uncertain behavior of skin: Secondary | ICD-10-CM | POA: Diagnosis not present

## 2022-09-25 ENCOUNTER — Encounter: Payer: Self-pay | Admitting: *Deleted

## 2022-09-25 ENCOUNTER — Other Ambulatory Visit (HOSPITAL_BASED_OUTPATIENT_CLINIC_OR_DEPARTMENT_OTHER): Payer: Self-pay

## 2022-09-25 DIAGNOSIS — Z006 Encounter for examination for normal comparison and control in clinical research program: Secondary | ICD-10-CM

## 2022-09-25 MED ORDER — AZITHROMYCIN 250 MG PO TABS
ORAL_TABLET | ORAL | 0 refills | Status: DC
  Filled 2022-09-25: qty 6, 5d supply, fill #0

## 2022-09-25 NOTE — Research (Signed)
Spoke with Charles [name] to remind him of his appointment tomorrow at 1000. Reminded him to be NPO and gave him the parking code. Voices understanding.

## 2022-09-26 ENCOUNTER — Other Ambulatory Visit: Payer: Self-pay

## 2022-09-26 ENCOUNTER — Encounter: Payer: BC Managed Care – PPO | Admitting: *Deleted

## 2022-09-26 DIAGNOSIS — Z006 Encounter for examination for normal comparison and control in clinical research program: Secondary | ICD-10-CM

## 2022-09-26 MED ORDER — STUDY - ESSENCE - OLEZARSEN 50 MG, 80 MG OR PLACEBO SQ INJECTION (PI-HILTY)
80.0000 mg | INJECTION | SUBCUTANEOUS | Status: DC
  Administered 2022-09-26: 80 mg via SUBCUTANEOUS
  Filled 2022-09-26: qty 0.8

## 2022-09-26 NOTE — Research (Addendum)
 Karel Jarvis Essence Week 25 Day 169 26-Sep-2022                     C4      41 mg/dL                         [] Clinically Significant  [x] Not Clinically Significant   Any further action needed to be taken per the PI?  No  Chrystie Nose, MD, [facility], FACP  [place]  [facility]  Medical Director of the Advanced Lipid Disorders &  Cardiovascular Risk Reduction Clinic Diplomate of the American Board of Clinical Lipidology Attending Cardiologist  Direct Dial: [phone]  Fax: [phone]  Website:  [email or web address]    Kapono Luhn Essence Week 25 Day 169 26-Sep-2022         Chemistry: BUN 23 mg/dL                                        [] Clinically Significant  [x] Not Clinically Significant  Creatine Kinase (CK) 308 U/L                [x] Clinically Significant  [] Not Clinically Significant Hs-C-Reactive Protein 24.2 mg/L           [] Clinically Significant  [x] Not Clinically Significant Hemoglobin A1c 6.1 %                           [] Clinically Significant  [x] Not Clinically Significant   Hematology: Monocyte % 12.9 %                               [] Clinically Significant  [x] Not Clinically Significant    Urine Chemistry: Protein Creatinine Ratio 269 mg/g            [] Clinically Significant  [x] Not Clinically Significant Urine Albumin 6.83 mg/dL                         [] Clinically Significant  [x] Not Clinically Significant Albumin Creatinine Ratio 204 mg/g           [] Clinically Significant  [x] Not Clinically Significant     Any further action needed to be taken per the PI?  Yes  What was previous CK? Will need to monitor  CK on Oct 17-2023 was 246, but he has been as high as 3626 before getting fluids in the ED back in Aug 2023  Chrystie Nose, MD, [facility], FACP  [place]  [facility]  Medical Director of the Advanced Lipid Disorders &  Cardiovascular Risk Reduction Clinic Diplomate of the American Board of Clinical  Lipidology Attending Cardiologist  Direct Dial: [phone]  Fax: [phone]  Website:  [email or web address]      TREATMENT DAY 169 - STUDY WEEK 25    Subject Number: [id number]             Randomization Number: [id number]            Date: 26-Sep-2022      [x] Vital Signs Collected - Blood Pressure:113/82 - Height:6 ft - Weight:200 lbs - Heart Rate:87 - Respiratory Rate:16 - Temperature:98.2 - Oxygen Saturation:99%  [x]  Physical Exam Completed by PI or SUB-I  [x]  Extended Urinalysis   [x]  Lab collection per protocol  [x]   (  abdominal pain only) since last visit  [x]  Assessment of ER Visits, Hospitalizations, and Inpatient Days  [x]  Adverse Events and Concomitant Medications  [x]  Diet, Lifestyle, and Alcohol Counseling   [x]  Study Drug: [place] Injection    Mr Forand is here for week 25 day 169. He reports no Abd pain, no visits to the Ed or urgent care since last seen. He does report going to his PCP and has a sinus infection.  He did Covid test at home (negative) He was placed on Zithromax for 5 days.  No other medication changes noted. VS taken at 1015. Blood work completed at 1036, and urine obtained at 1040.Injection given at 1055. Kit number [id number]. Dr Riley Kill did exam. Next appointment scheduled for Feb 7 at 1000.   Current Outpatient Medications:    amLODipine (NORVASC) 5 MG tablet, TAKE ONE TABLET (5 MG DOSE) BY MOUTH DAILY., Disp: 90 tablet, Rfl: 2   azithromycin (ZITHROMAX Z-PAK) 250 MG tablet, Take 2 tablets by mouth on the first day, and then 1 tablet daily for 4 days, Disp: 6 each, Rfl: 0   Blood Glucose Monitoring Suppl (FREESTYLE LITE) w/Device KIT, Use to check blood sugars 3 times daily, Disp: 1 kit, Rfl: 0   cholecalciferol (VITAMIN D3) 25 MCG (1000 UNIT) tablet, Take 2,000 Units by mouth daily., Disp: , Rfl:    fenofibrate (TRICOR) 48 MG tablet, Take 1 tablet (48 mg total) by mouth daily., Disp: 90 tablet, Rfl: 3   glucose blood test strip, Use to test blood  sugars 3 times daily, Disp: 300 each, Rfl: 5   icosapent Ethyl (VASCEPA) 1 g capsule, Take 2 capsules (2 g total) by mouth 2 (two) times daily., Disp: 120 capsule, Rfl: 11   insulin glargine-yfgn (SEMGLEE, YFGN,) 100 UNIT/ML Pen, Inject 22 Units into the skin daily., Disp: 30 mL, Rfl: 0   insulin lispro (HUMALOG KWIKPEN) 100 UNIT/ML KwikPen, Inject 2-12u into the skin 3x a day with meals per sliding scale. BG=180-200: 2u; BG=201-250: 4u; BG=251-300: 6u; BG=3-1-350: 8u; BG=351-400: 10u; and BG=401-450: 12u, Disp: 30 mL, Rfl: 2   Insulin Pen Needle (PENTIPS) 31G X 5 MM MISC, use as directed to inject insulin up to 4 times per day, Disp: 100 each, Rfl: 11   Insulin Pen Needle (PENTIPS) 31G X 5 MM MISC, Use to inject insulin 4 times a day., Disp: 100 each, Rfl: 2   Lancets (FREESTYLE) lancets, Use as directed to test blood sugars 3 times daily, Disp: 300 each, Rfl: 5   lisinopril (ZESTRIL) 5 MG tablet, Take 1 tablet by mouth daily, Disp: 90 tablet, Rfl: 3   Multiple Vitamins-Minerals (CENTURY PO), Take by mouth., Disp: , Rfl:   Current Facility-Administered Medications:    Study - ESSENCE - olezarsen 50 mg, 80 mg or placebo SQ injection (PI-Hilty), 80 mg, Subcutaneous, Q28 days, Hilty, Lisette Abu, MD

## 2022-10-02 ENCOUNTER — Other Ambulatory Visit (HOSPITAL_BASED_OUTPATIENT_CLINIC_OR_DEPARTMENT_OTHER): Payer: Self-pay

## 2022-10-02 ENCOUNTER — Other Ambulatory Visit: Payer: Self-pay | Admitting: Cardiovascular Disease

## 2022-10-03 ENCOUNTER — Other Ambulatory Visit (HOSPITAL_BASED_OUTPATIENT_CLINIC_OR_DEPARTMENT_OTHER): Payer: Self-pay

## 2022-10-03 ENCOUNTER — Other Ambulatory Visit: Payer: Self-pay

## 2022-10-03 NOTE — Telephone Encounter (Signed)
Refer to PCP or endocrinologist

## 2022-10-04 DIAGNOSIS — L720 Epidermal cyst: Secondary | ICD-10-CM | POA: Diagnosis not present

## 2022-10-19 ENCOUNTER — Other Ambulatory Visit (HOSPITAL_BASED_OUTPATIENT_CLINIC_OR_DEPARTMENT_OTHER): Payer: Self-pay

## 2022-10-19 MED ORDER — INSULIN GLARGINE-YFGN 100 UNIT/ML ~~LOC~~ SOPN
22.0000 [IU] | PEN_INJECTOR | Freq: Every day | SUBCUTANEOUS | 3 refills | Status: DC
  Filled 2022-10-19: qty 18, 81d supply, fill #0
  Filled 2022-10-22: qty 15, 68d supply, fill #0
  Filled 2022-10-22 (×3): qty 18, 81d supply, fill #0
  Filled 2023-01-23: qty 18, 81d supply, fill #1
  Filled 2023-04-12: qty 18, 81d supply, fill #2
  Filled 2023-07-01: qty 18, 81d supply, fill #3
  Filled 2023-09-19: qty 18, 81d supply, fill #4

## 2022-10-22 ENCOUNTER — Other Ambulatory Visit: Payer: Self-pay

## 2022-10-22 ENCOUNTER — Other Ambulatory Visit (HOSPITAL_BASED_OUTPATIENT_CLINIC_OR_DEPARTMENT_OTHER): Payer: Self-pay

## 2022-10-22 NOTE — Research (Cosign Needed)
Lenis Noon Essence Qualification 21-March-2022    Apolipoprotein B48   0.97 mg/dL              '[]'$ Clinically Significant  '[x]'$ Not Clinically Significant   Any further action needed to be taken per the PI? No  Pixie Casino, MD, [facility], [place] Director of the Advanced Lipid Disorders &  Cardiovascular Risk Reduction Clinic Diplomate of the American Board of Clinical Lipidology Attending Cardiologist  Direct Dial: [phone]  Fax: [phone]  Website:  [email or web address]

## 2022-10-23 ENCOUNTER — Encounter: Payer: Self-pay | Admitting: *Deleted

## 2022-10-23 DIAGNOSIS — Z006 Encounter for examination for normal comparison and control in clinical research program: Secondary | ICD-10-CM

## 2022-10-23 NOTE — Research (Signed)
Message left to remind Charles [name] of his appointment tomorrow with research. Also gave him the parking code.

## 2022-10-24 ENCOUNTER — Encounter: Payer: Self-pay | Admitting: *Deleted

## 2022-10-24 DIAGNOSIS — Z006 Encounter for examination for normal comparison and control in clinical research program: Secondary | ICD-10-CM

## 2022-10-24 NOTE — Research (Signed)
Charles [name] called states he has a fever and will not be able to come in today. Will call and reschedule him later.

## 2022-10-24 NOTE — Research (Addendum)
Spoke with Charles [name] to see if he feels better. He states he feels better. He called and left a message on my machine stating he had a fever yesterday.  Scheduled next appointment for Friday  Feb. 9 at 1000. Voices understanding.

## 2022-10-26 ENCOUNTER — Other Ambulatory Visit: Payer: Self-pay

## 2022-10-26 ENCOUNTER — Encounter: Payer: BC Managed Care – PPO | Admitting: *Deleted

## 2022-10-26 DIAGNOSIS — Z006 Encounter for examination for normal comparison and control in clinical research program: Secondary | ICD-10-CM

## 2022-10-26 MED ORDER — STUDY - ESSENCE - OLEZARSEN 50 MG, 80 MG OR PLACEBO SQ INJECTION (PI-HILTY)
80.0000 mg | INJECTION | SUBCUTANEOUS | Status: DC
  Administered 2022-10-26: 80 mg via SUBCUTANEOUS
  Filled 2022-10-26: qty 0.8

## 2022-10-26 NOTE — Research (Signed)
     TREATMENT DAY 197 - STUDY WEEK 29    Subject Number: [id number]              Randomization Number: [id number]           Date:24-Oct-2022      '[x]'$ Vital Signs Collected - Blood Pressure:115/76 - Heart Rate:82 - Respiratory Rate:16 - Temperature:98.6 - Oxygen Saturation:99%    '[x]'$   (abdominal pain only) since last visit  '[x]'$  Assessment of ER Visits, Hospitalizations, and Inpatient Days  '[x]'$  Adverse Events and Concomitant Medications  '[x]'$  Diet, Lifestyle, and Alcohol Counseling   '[x]'$  Study Drug: [place] Injection   Mr Wichert is here for Week 29 Day 197 of Essence research study. He reports no abd pain, no other pain, and no visits to the Ed or Urgent care since last seen. Vs taken at 1027, Injection given in right lower abd at 1057. Tol well.  Used kit number [id number]  Reports no changes in his medications. Next appointment scheduled for March 4 at 1000.    Current Outpatient Medications:    amLODipine (NORVASC) 5 MG tablet, TAKE ONE TABLET (5 MG DOSE) BY MOUTH DAILY., Disp: 90 tablet, Rfl: 2   azithromycin (ZITHROMAX Z-PAK) 250 MG tablet, Take 2 tablets by mouth on the first day, and then 1 tablet daily for 4 days, Disp: 6 each, Rfl: 0   Blood Glucose Monitoring Suppl (FREESTYLE LITE) w/Device KIT, Use to check blood sugars 3 times daily, Disp: 1 kit, Rfl: 0   cholecalciferol (VITAMIN D3) 25 MCG (1000 UNIT) tablet, Take 2,000 Units by mouth daily., Disp: , Rfl:    fenofibrate (TRICOR) 48 MG tablet, Take 1 tablet (48 mg total) by mouth daily., Disp: 90 tablet, Rfl: 3   glucose blood test strip, Use to test blood sugars 3 times daily, Disp: 300 each, Rfl: 5   icosapent Ethyl (VASCEPA) 1 g capsule, Take 2 capsules (2 g total) by mouth 2 (two) times daily., Disp: 120 capsule, Rfl: 11   insulin glargine-yfgn (SEMGLEE, YFGN,) 100 UNIT/ML Pen, Inject 22 Units into the skin daily., Disp: 30 mL, Rfl: 3   insulin lispro (HUMALOG KWIKPEN) 100 UNIT/ML KwikPen, Inject 2-12u into the skin 3x a day with  meals per sliding scale. BG=180-200: 2u; BG=201-250: 4u; BG=251-300: 6u; BG=3-1-350: 8u; BG=351-400: 10u; and BG=401-450: 12u, Disp: 30 mL, Rfl: 2   Insulin Pen Needle (PENTIPS) 31G X 5 MM MISC, use as directed to inject insulin up to 4 times per day, Disp: 100 each, Rfl: 11   Insulin Pen Needle (PENTIPS) 31G X 5 MM MISC, Use to inject insulin 4 times a day., Disp: 100 each, Rfl: 2   Lancets (FREESTYLE) lancets, Use as directed to test blood sugars 3 times daily, Disp: 300 each, Rfl: 5   lisinopril (ZESTRIL) 5 MG tablet, Take 1 tablet by mouth daily, Disp: 90 tablet, Rfl: 3   Multiple Vitamins-Minerals (CENTURY PO), Take by mouth., Disp: , Rfl:   Current Facility-Administered Medications:    Study - ESSENCE - olezarsen 50 mg, 80 mg or placebo SQ injection (PI-Hilty), 80 mg, Subcutaneous, Q28 days, Pixie Casino, MD, 80 mg at 10/26/22 1057

## 2022-11-03 ENCOUNTER — Other Ambulatory Visit (HOSPITAL_BASED_OUTPATIENT_CLINIC_OR_DEPARTMENT_OTHER): Payer: Self-pay

## 2022-11-05 ENCOUNTER — Other Ambulatory Visit (HOSPITAL_BASED_OUTPATIENT_CLINIC_OR_DEPARTMENT_OTHER): Payer: Self-pay

## 2022-11-05 ENCOUNTER — Other Ambulatory Visit: Payer: Self-pay

## 2022-11-05 MED ORDER — INSULIN LISPRO (1 UNIT DIAL) 100 UNIT/ML (KWIKPEN)
2.0000 [IU] | PEN_INJECTOR | Freq: Three times a day (TID) | SUBCUTANEOUS | 2 refills | Status: DC
  Filled 2022-11-05: qty 30, 84d supply, fill #0
  Filled 2023-02-18: qty 30, 84d supply, fill #1
  Filled 2023-05-09: qty 30, 84d supply, fill #2

## 2022-11-12 ENCOUNTER — Other Ambulatory Visit (HOSPITAL_BASED_OUTPATIENT_CLINIC_OR_DEPARTMENT_OTHER): Payer: Self-pay

## 2022-11-12 ENCOUNTER — Other Ambulatory Visit: Payer: Self-pay

## 2022-11-13 ENCOUNTER — Other Ambulatory Visit (HOSPITAL_BASED_OUTPATIENT_CLINIC_OR_DEPARTMENT_OTHER): Payer: Self-pay

## 2022-11-16 ENCOUNTER — Encounter: Payer: Self-pay | Admitting: *Deleted

## 2022-11-16 DIAGNOSIS — Z006 Encounter for examination for normal comparison and control in clinical research program: Secondary | ICD-10-CM

## 2022-11-16 NOTE — Research (Signed)
Message sent to remind Charles [name] of his appointment Monday at 1000, no need to be NPO. Gave him the parking code.

## 2022-11-19 ENCOUNTER — Other Ambulatory Visit: Payer: Self-pay

## 2022-11-19 ENCOUNTER — Encounter: Payer: BC Managed Care – PPO | Admitting: *Deleted

## 2022-11-19 DIAGNOSIS — Z006 Encounter for examination for normal comparison and control in clinical research program: Secondary | ICD-10-CM

## 2022-11-19 MED ORDER — STUDY - ESSENCE - OLEZARSEN 50 MG, 80 MG OR PLACEBO SQ INJECTION (PI-HILTY)
80.0000 mg | INJECTION | SUBCUTANEOUS | Status: DC
  Administered 2022-11-19: 80 mg via SUBCUTANEOUS
  Filled 2022-11-19: qty 0.8

## 2022-11-19 NOTE — Research (Signed)
     TREATMENT DAY 225 - STUDY WEEK 33    Subject Number: [id number]            Randomization [id number] Date:  19-November-2022      '[x]'$ Vital Signs Collected - Blood Pressure:125/83 - Heart Rate:85 - Respiratory Rate:16 - Temperature:98.7 - Oxygen Saturation:98%   '[x]'$  (abdominal pain only) since last visit  '[x]'$  Assessment of ER Visits, Hospitalizations, and Inpatient Days  '[x]'$  Adverse Events and Concomitant Medications  '[x]'$  Diet, Lifestyle, and Alcohol Counseling   '[x]'$  Study Drug: [place] Injection   Charles [name] is here for Week 33 Day 225 of Essence research study. He reports no abd pain, no visits to the ED or Urgent care, and no medications changes since last seen. Vs taken at 1005. Injection given in left lower abd at 1032 kit number [id number]. Tol well. Scheduled next appointment for December 17, 2022 at 1000.   Current Outpatient Medications:    amLODipine (NORVASC) 5 MG tablet, TAKE ONE TABLET (5 MG DOSE) BY MOUTH DAILY., Disp: 90 tablet, Rfl: 2   azithromycin (ZITHROMAX Z-PAK) 250 MG tablet, Take 2 tablets by mouth on the first day, and then 1 tablet daily for 4 days, Disp: 6 each, Rfl: 0   Blood Glucose Monitoring Suppl (FREESTYLE LITE) w/Device KIT, Use to check blood sugars 3 times daily, Disp: 1 kit, Rfl: 0   cholecalciferol (VITAMIN D3) 25 MCG (1000 UNIT) tablet, Take 2,000 Units by mouth daily., Disp: , Rfl:    fenofibrate (TRICOR) 48 MG tablet, Take 1 tablet (48 mg total) by mouth daily., Disp: 90 tablet, Rfl: 3   glucose blood test strip, Use to test blood sugars 3 times daily, Disp: 300 each, Rfl: 5   icosapent Ethyl (VASCEPA) 1 g capsule, Take 2 capsules (2 g total) by mouth 2 (two) times daily., Disp: 120 capsule, Rfl: 11   insulin glargine-yfgn (SEMGLEE, YFGN,) 100 UNIT/ML Pen, Inject 22 Units into the skin daily., Disp: 30 mL, Rfl: 3   insulin lispro (HUMALOG KWIKPEN) 100 UNIT/ML KwikPen, Inject 2-12 Units into the skin 3 (three) times daily with meals, per sliding scale.  BG=180-200: 2u; BG=201-250: 4u; BG=251-300: 6u; BG=3-1-350: 8u; BG=351-400: 10u; and BG=401-450: 12u., Disp: 30 mL, Rfl: 2   Insulin Pen Needle (PENTIPS) 31G X 5 MM MISC, use as directed to inject insulin up to 4 times per day, Disp: 100 each, Rfl: 11   Insulin Pen Needle (PENTIPS) 31G X 5 MM MISC, Use to inject insulin 4 times a day., Disp: 100 each, Rfl: 2   Lancets (FREESTYLE) lancets, Use as directed to test blood sugars 3 times daily, Disp: 300 each, Rfl: 5   lisinopril (ZESTRIL) 5 MG tablet, Take 1 tablet by mouth daily, Disp: 90 tablet, Rfl: 3   Multiple Vitamins-Minerals (CENTURY PO), Take by mouth., Disp: , Rfl:   Current Facility-Administered Medications:    Study - ESSENCE - olezarsen 50 mg, 80 mg or placebo SQ injection (PI-Charles [name]), 80 mg, Subcutaneous, Q28 days, Charles [name], Charles Corwin, MD

## 2022-11-20 ENCOUNTER — Other Ambulatory Visit (HOSPITAL_BASED_OUTPATIENT_CLINIC_OR_DEPARTMENT_OTHER): Payer: Self-pay

## 2022-11-20 DIAGNOSIS — B360 Pityriasis versicolor: Secondary | ICD-10-CM | POA: Diagnosis not present

## 2022-11-20 DIAGNOSIS — L905 Scar conditions and fibrosis of skin: Secondary | ICD-10-CM | POA: Diagnosis not present

## 2022-11-20 DIAGNOSIS — L218 Other seborrheic dermatitis: Secondary | ICD-10-CM | POA: Diagnosis not present

## 2022-11-20 DIAGNOSIS — D225 Melanocytic nevi of trunk: Secondary | ICD-10-CM | POA: Diagnosis not present

## 2022-11-20 MED ORDER — KETOCONAZOLE 2 % EX CREA
1.0000 | TOPICAL_CREAM | Freq: Two times a day (BID) | CUTANEOUS | 2 refills | Status: AC
  Filled 2022-11-20: qty 30, 14d supply, fill #0

## 2022-11-25 ENCOUNTER — Encounter (INDEPENDENT_AMBULATORY_CARE_PROVIDER_SITE_OTHER): Payer: Self-pay

## 2022-11-30 ENCOUNTER — Other Ambulatory Visit (HOSPITAL_BASED_OUTPATIENT_CLINIC_OR_DEPARTMENT_OTHER): Payer: Self-pay

## 2022-11-30 ENCOUNTER — Other Ambulatory Visit (HOSPITAL_BASED_OUTPATIENT_CLINIC_OR_DEPARTMENT_OTHER): Payer: Self-pay | Admitting: Cardiovascular Disease

## 2022-11-30 MED ORDER — AMLODIPINE BESYLATE 5 MG PO TABS
5.0000 mg | ORAL_TABLET | Freq: Every day | ORAL | 11 refills | Status: DC
  Filled 2022-11-30: qty 30, 30d supply, fill #0
  Filled 2023-01-23: qty 30, 30d supply, fill #1
  Filled 2023-02-26: qty 30, 30d supply, fill #2
  Filled 2023-04-01: qty 30, 30d supply, fill #3
  Filled 2023-05-02: qty 30, 30d supply, fill #4
  Filled 2023-06-06: qty 30, 30d supply, fill #5
  Filled 2023-07-08: qty 30, 30d supply, fill #6
  Filled 2023-08-06: qty 30, 30d supply, fill #7
  Filled 2023-09-03: qty 30, 30d supply, fill #8
  Filled 2023-10-02: qty 30, 30d supply, fill #9
  Filled 2023-11-01: qty 30, 30d supply, fill #10
  Filled 2023-11-28: qty 30, 30d supply, fill #11

## 2022-12-06 ENCOUNTER — Encounter (HOSPITAL_BASED_OUTPATIENT_CLINIC_OR_DEPARTMENT_OTHER): Payer: Self-pay | Admitting: Internal Medicine

## 2022-12-06 ENCOUNTER — Ambulatory Visit (HOSPITAL_BASED_OUTPATIENT_CLINIC_OR_DEPARTMENT_OTHER): Payer: BC Managed Care – PPO | Admitting: Internal Medicine

## 2022-12-06 VITALS — BP 140/90 | HR 81 | Ht 72.0 in | Wt 221.0 lb

## 2022-12-06 DIAGNOSIS — G720 Drug-induced myopathy: Secondary | ICD-10-CM

## 2022-12-06 DIAGNOSIS — T466X5D Adverse effect of antihyperlipidemic and antiarteriosclerotic drugs, subsequent encounter: Secondary | ICD-10-CM

## 2022-12-06 DIAGNOSIS — I10 Essential (primary) hypertension: Secondary | ICD-10-CM

## 2022-12-06 DIAGNOSIS — R748 Abnormal levels of other serum enzymes: Secondary | ICD-10-CM

## 2022-12-06 DIAGNOSIS — Z006 Encounter for examination for normal comparison and control in clinical research program: Secondary | ICD-10-CM

## 2022-12-06 NOTE — Patient Instructions (Signed)
Medication Instructions:  NO CHANGES  *If you need a refill on your cardiac medications before your next appointment, please call your pharmacy*   Follow-Up: At [facility], you and your health needs are our priority.  As part of our continuing mission to provide you with exceptional heart care, we have created designated Provider Care Teams.  These Care Teams include your primary Cardiologist (physician) and Advanced Practice Providers (APPs -  Physician Assistants and Nurse Practitioners) who all work together to provide you with the care you need, when you need it.  We recommend signing up for the patient portal called "MyChart".  Sign up information is provided on this After Visit Summary.  MyChart is used to connect with patients for Virtual Visits (Telemedicine).  Patients are able to view lab/test results, encounter notes, upcoming appointments, etc.  Non-urgent messages can be sent to your provider as well.   To learn more about what you can do with MyChart, go to [email or web address].    Your next appointment:    12 months with Dr. Hilty  

## 2022-12-06 NOTE — Progress Notes (Signed)
LIPID CLINIC CONSULT NOTE  Chief Complaint:  Follow-up high triglycerides  Primary Care Physician: Tisovec, Fransico Him, MD  Primary Cardiologist:  Sanda Klein, MD  HPI:  Charles [name] is a 52 y.o. 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 Sallyanne Kuster. This is a pleasant 52 year old male with a history of high triglycerides in the past who recently had acute onset diabetes associated with acute kidney injury and pancreatitis.  Triglycerides were markedly elevated in the thousands.  A lipid profile showed total cholesterol 680, triglycerides 2496, HDL of 12 and LDL 62.  He had previously been on fenofibrate and remotely had been on a statin but that was discontinued by his PCP.  He recently was seen by Dr. Sallyanne Kuster in follow-up and started on Vascepa.  He has not been on that medicine for a week without any issues.  He does report heart disease in the family including his father who had CABG in his 53s and had repeat bypass surgery in his 39s.  He is not certain if he had a history of high triglycerides but I suspect this is the case.  He also has 3 children and would like genetic screening today to try to evaluate their risk.  He reports changes in his diet recently to reduce carbohydrates but is not necessarily focused on lower fat.  05/22/2022  Charles [name] returns today for follow-up.  He has subsequently enrolled in the ESSENCE clinical trial evaluating the RNA-inhibitor to APOC3.  When I last saw him he was placed on statin therapy.  During the research trial, routine lab monitoring indicated a very high CK of 3626.  I contacted him about this and he reported being asymptomatic.  Because of the significance of this, I advised him to present to the urgent care in [facility] for IV hydration.  He was evaluated and treated there with a reduction in his CK and subsequent labs performed about a week ago showed his CK had declined down to 528.  He  again remains asymptomatic without any weakness, muscle aches or change in urination.  I had recommended stopping his statin and fibrate when his CK was elevated.  Would not advise retrial with statins however he might be a candidate to add back a fibrate.  12/06/2022  Charles [name] is seen today in follow-up.  He continues in the essence trial.  Unfortunately he had elevated CK as mentioned above.  This had improved but has not been recently rechecked.  Unfortunately he was also taken off the statin for this.  I am blinded to his lipids.  Today says he is doing well although he has had some headache recently.  His blood pressure was elevated today 140/90.  In general he says it has been better controlled.  He is under some stress.  PMHx:  Past Medical History:  Diagnosis Date   Diabetes ([place])    Hyperlipidemia    Hypertension     Past Surgical History:  Procedure Laterality Date   MASS EXCISION  06/17/2012   Procedure: MINOR EXCISION OF MASS;  Surgeon: Haywood Lasso, MD;  Location: [facility];  Service: General;  Laterality: N/A;  Removal Cyst of Right Upper Back    FAMHx:  Family History  Problem Relation Age of Onset   Lymphoma Mother    Cancer Mother        lymphoma   Heart disease Father  SOCHx:   reports that he has never smoked. He has never used smokeless tobacco. He reports current alcohol use. He reports that he does not use drugs.  ALLERGIES:  Allergies  Allergen Reactions   Statins Other (See Comments)    Myopathies - elevated CK    ROS: Pertinent items noted in HPI and remainder of comprehensive ROS otherwise negative.  HOME MEDS: Current Outpatient Medications on File Prior to Visit  Medication Sig Dispense Refill   amLODipine (NORVASC) 5 MG tablet Take 1 tablet (5 mg total) by mouth daily. 30 tablet 11   Blood Glucose Monitoring Suppl (FREESTYLE LITE) w/Device KIT Use to check blood sugars 3 times daily 1 kit 0   cholecalciferol (VITAMIN  D3) 25 MCG (1000 UNIT) tablet Take 2,000 Units by mouth daily.     fenofibrate (TRICOR) 48 MG tablet Take 1 tablet (48 mg total) by mouth daily. 90 tablet 3   glucose blood test strip Use to test blood sugars 3 times daily 300 each 5   icosapent Ethyl (VASCEPA) 1 g capsule Take 2 capsules (2 g total) by mouth 2 (two) times daily. 120 capsule 11   insulin glargine-yfgn (SEMGLEE, YFGN,) 100 UNIT/ML Pen Inject 22 Units into the skin daily. 30 mL 3   insulin lispro (HUMALOG KWIKPEN) 100 UNIT/ML KwikPen Inject 2-12 Units into the skin 3 (three) times daily with meals, per sliding scale. BG=180-200: 2u; BG=201-250: 4u; BG=251-300: 6u; BG=3-1-350: 8u; BG=351-400: 10u; and BG=401-450: 12u. 30 mL 2   Insulin Pen Needle (PENTIPS) 31G X 5 MM MISC use as directed to inject insulin up to 4 times per day 100 each 11   ketoconazole (NIZORAL) 2 % cream Apply 1 application topically 2 (two) times daily for 2 weeks. 30 g 2   Lancets (FREESTYLE) lancets Use as directed to test blood sugars 3 times daily 300 each 5   lisinopril (ZESTRIL) 5 MG tablet Take 1 tablet by mouth daily 90 tablet 3   Multiple Vitamins-Minerals (CENTURY PO) Take by mouth.     Current Facility-Administered Medications on File Prior to Visit  Medication Dose Route Frequency Provider Last Rate Last Admin   Study - ESSENCE - olezarsen 50 mg, 80 mg or placebo SQ injection (PI-Syair Fricker)  80 mg Subcutaneous Q28 days Pixie Casino, MD   80 mg at 11/19/22 1032    LABS/IMAGING: No results found for this or any previous visit (from the past 67 hour(s)). No results found.  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   Charles [name] unfortunately had to stop his statin because of elevated CKs.  That had resolved has not been reassessed.  I will reach out to our trial coordinator to see if we could recheck his CK.  He denies any recurrent pancreatitis.  Blood pressure was a little elevated today.  I  advised him to follow these at home and to bring those readings to his appointment that is upcoming with Dr. Sallyanne Kuster in 2 weeks.  Plan follow-up with me annually or sooner as necessary.  Please do not check lipids as he is enrolled in the research trial.  Pixie Casino, MD, [facility], [place] Director of the Advanced Lipid Disorders &  Cardiovascular Risk Reduction Clinic Diplomate of the American Board of Clinical Lipidology Attending Cardiologist  Direct Dial: [phone]  Fax: [phone]  Website:  [email or web address]  Nadean Corwin Angelyn Osterberg 12/06/2022, 1:20 PM

## 2022-12-17 ENCOUNTER — Other Ambulatory Visit: Payer: Self-pay

## 2022-12-17 ENCOUNTER — Encounter: Payer: BC Managed Care – PPO | Admitting: *Deleted

## 2022-12-17 DIAGNOSIS — Z006 Encounter for examination for normal comparison and control in clinical research program: Secondary | ICD-10-CM

## 2022-12-17 MED ORDER — STUDY - ESSENCE - OLEZARSEN 50 MG, 80 MG OR PLACEBO SQ INJECTION (PI-HILTY)
80.0000 mg | INJECTION | SUBCUTANEOUS | Status: DC
  Administered 2022-12-17: 80 mg via SUBCUTANEOUS
  Filled 2022-12-17: qty 0.8

## 2022-12-17 NOTE — Research (Signed)
     TREATMENT DAY 253 - STUDY WEEK 37    Subject Number: [id number]             Randomization Number: [id number]             Date:17-Dec-2022    [x] Vital Signs Collected - Blood Pressure:126/80 - Heart Rate:82 - Respiratory Rate:18 - Temperature:98.7 - Oxygen Saturation:98%   [x]  Extended Urinalysis   [x]  Lab collection per protocol  [x]  (abdominal pain only) since last visit  [x]  Assessment of ER Visits, Hospitalizations, and Inpatient Days  [x]  Adverse Events and Concomitant Medications  [x]  Diet, Lifestyle, and Alcohol Counseling   [x]  Study Drug: [place] Injection   Mr Graig is here for Week 37, Day 253. He reports no abd pain, no changes in his meds, No ED and Urgent care visits since last visit. VS taken at 1008. Injection given at 1025 in right lower abd. Tol well. Kit number [id number]. Scheduled next visit for April 30 at 1000.   Current Outpatient Medications:    amLODipine (NORVASC) 5 MG tablet, Take 1 tablet (5 mg total) by mouth daily., Disp: 30 tablet, Rfl: 11   Blood Glucose Monitoring Suppl (FREESTYLE LITE) w/Device KIT, Use to check blood sugars 3 times daily, Disp: 1 kit, Rfl: 0   cholecalciferol (VITAMIN D3) 25 MCG (1000 UNIT) tablet, Take 2,000 Units by mouth daily., Disp: , Rfl:    fenofibrate (TRICOR) 48 MG tablet, Take 1 tablet (48 mg total) by mouth daily., Disp: 90 tablet, Rfl: 3   glucose blood test strip, Use to test blood sugars 3 times daily, Disp: 300 each, Rfl: 5   icosapent Ethyl (VASCEPA) 1 g capsule, Take 2 capsules (2 g total) by mouth 2 (two) times daily., Disp: 120 capsule, Rfl: 11   insulin glargine-yfgn (SEMGLEE, YFGN,) 100 UNIT/ML Pen, Inject 22 Units into the skin daily., Disp: 30 mL, Rfl: 3   insulin lispro (HUMALOG KWIKPEN) 100 UNIT/ML KwikPen, Inject 2-12 Units into the skin 3 (three) times daily with meals, per sliding scale. BG=180-200: 2u; BG=201-250: 4u; BG=251-300: 6u; BG=3-1-350: 8u; BG=351-400: 10u; and BG=401-450: 12u., Disp: 30 mL, Rfl:  2   Insulin Pen Needle (PENTIPS) 31G X 5 MM MISC, use as directed to inject insulin up to 4 times per day, Disp: 100 each, Rfl: 11   ketoconazole (NIZORAL) 2 % cream, Apply 1 application topically 2 (two) times daily for 2 weeks., Disp: 30 g, Rfl: 2   Lancets (FREESTYLE) lancets, Use as directed to test blood sugars 3 times daily, Disp: 300 each, Rfl: 5   lisinopril (ZESTRIL) 5 MG tablet, Take 1 tablet by mouth daily, Disp: 90 tablet, Rfl: 3   Multiple Vitamins-Minerals (CENTURY PO), Take by mouth., Disp: , Rfl:   Current Facility-Administered Medications:    Study - ESSENCE - olezarsen 50 mg, 80 mg or placebo SQ injection (PI-Hilty), 80 mg, Subcutaneous, Q28 days, Hilty, Nadean Corwin, MD

## 2022-12-18 ENCOUNTER — Encounter: Payer: Self-pay | Admitting: Cardiovascular Disease

## 2022-12-18 ENCOUNTER — Ambulatory Visit: Payer: BC Managed Care – PPO | Attending: Cardiovascular Disease | Admitting: Cardiovascular Disease

## 2022-12-18 VITALS — BP 124/76 | HR 72 | Ht 72.0 in | Wt 218.2 lb

## 2022-12-18 DIAGNOSIS — E782 Mixed hyperlipidemia: Secondary | ICD-10-CM | POA: Diagnosis not present

## 2022-12-18 DIAGNOSIS — G720 Drug-induced myopathy: Secondary | ICD-10-CM

## 2022-12-18 DIAGNOSIS — T466X5D Adverse effect of antihyperlipidemic and antiarteriosclerotic drugs, subsequent encounter: Secondary | ICD-10-CM

## 2022-12-18 DIAGNOSIS — I10 Essential (primary) hypertension: Secondary | ICD-10-CM

## 2022-12-18 DIAGNOSIS — T466X5A Adverse effect of antihyperlipidemic and antiarteriosclerotic drugs, initial encounter: Secondary | ICD-10-CM

## 2022-12-18 DIAGNOSIS — E139 Other specified diabetes mellitus without complications: Secondary | ICD-10-CM

## 2022-12-18 NOTE — Progress Notes (Signed)
Cardiology Office Note:    Date:  12/18/2022   ID:  Charles [name], DOB 03-28-1971, MRN [id number]  PCP:  Haywood Pao, MD   [facility] Providers Cardiologist:  Sanda Klein, MD     Referring MD: Haywood Pao, MD   No chief complaint on file.   History of Present Illness:    Charles [name] is a 52 y.o. male with a hx of longstanding mixed hyperlipidemia and hypertension, recently hospitalized with acute pancreatitis in the setting of severe hypertriglyceridemia.  He had diabetic ketoacidosis and was diagnosed with diabetes mellitus during the same hospitalization (although hemoglobin A1c elevation suggest this is a more chronic problem).  He also has a family history of early onset coronary artery disease (father with myocardial infarction and bypass surgery at age 12).  He is enrolled in the ESSENCE (olezarsen vs placebo) lipid-lowering trial in our clinic and is seeing Dr. Debara Pickett in the lipid clinic.  He is also seeing Dr. Carolin Sicks at [facility], for recent problems with acute kidney injury during the episode of pancreatitis.  He continues have excellent metabolic control.  His most recent hemoglobin A1c checked in November was 5.7%.  His typical fasting blood sugar in the mornings is 88-118.  He has had occasional mild symptoms of hypoglycemia if he delays a meal after administering fast acting insulin, but the lowest glucose is documented has been around 74.  He typically tries to get 30 to 45 minutes of exercise daily, mostly aerobic exercise.  Recently he has been unable to exercise as much due to numerous job-related duties, and he has gained a few pounds of weight.  He remains just under the obesity mark with a BMI of 29.  Triglyceride control has been excellent, but his APO B levels are still a little bit above target.  Last August his CK abruptly increased to 3500 and his rosuvastatin was discontinued and the dose of fenofibrate was increased to 48 mg daily,  with rapid resolution of the abnormalities.  He was never symptomatic.  He had another CK checked yesterday but the results are not available for review.  He has normal kidney function.  The patient specifically denies any chest pain at rest exertion, dyspnea at rest or with exertion, orthopnea, paroxysmal nocturnal dyspnea, syncope, palpitations, focal neurological deficits, intermittent claudication, lower extremity edema, unexplained weight gain, cough, hemoptysis or wheezing.   He has made a remarkable metabolic turnaround.  His most recent lipid profile from 02/21/2022 shows a triglyceride level of only 170, HDL 42, LDL 125.  NMR assessment confirms the suspicion that he has small dense LDL particles (total particle #[id number], LDL size 20.5, small LDL particle #863).  He is currently taking Vascepa and fenofibrate 48 mg daily, in addition to study drug versus placebo.  His GAD65 antibodies were markedly positive but he still many factors insulin with a C-peptide of 2.4.  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  Had a normal ECG stress test on 01/17/2022 exercising for over 10 minutes/over 12 METS.  He was hospitalized with acute abdominal pain on 12/08/2021 at [facility].  He was diagnosed with acute pancreatitis.  Lipase was 454, CT showed typical findings of acute pancreatitis, also showed diffuse hepatic steatosis.  There was no evidence of gallstones or pancreatic abscess or pseudocyst.   He had severe metabolic abnormalities.  Peak glucose was 926 and sodium was 164, creatinine 3.65.  He was acidotic with pH 7.045 anion gap of 33  and serum bicarb of 5, beta hydroxybutyrate was greater than 7.5, consistent with diabetic ketoacidosis and severe volume contraction.  On arrival, hemoglobin A1c was 14.6% suggesting at least a few months of diabetes mellitus.  Direct LDL was only 62, but HDL was only 12, total cholesterol was 680 and triglycerides were 2496 (repeat assay 2611).  By the time  of discharge his triglycerides had decreased to [phone] range.  Plasmapheresis was considered but deemed unnecessary.  Labs from his primary care physician and recent hospitalization from:  2019  triglycerides 375, total cholesterol 189, HDL 37, LDL 77 and fasting glucose 109 2020  triglycerides 284, total cholesterol 205, HDL 36, LDL 112, fasting glucose 113 11/03/2020 triglycerides 2364, total cholesterol 300, HDL 11, fasting glucose 233.  Same lab assay his sodium was low at 121.  He was advised to go to the emergency department but he was asymptomatic and declined. 12/08/2021 triglycerides 2496, total cholesterol 680, HDL 12, direct LDL 62 02/21/2022 triglycerides 170, total cholesterol 197, HDL 42, LDL 125  Charles [name] has been aware of hypertriglyceridemia for about 30 years, since he was a young adult.  He has been prescribed statins repeatedly, but these have consistently led to abnormalities in his liver function tests and have been stopped, followed by normalization of the liver test abnormalities.   He does not have a history of CAD or PAD.  I do not think he is ever had a formal evaluation for such either.  During his hospitalization with acute pancreatitis an abdominal CT was performed that did not mention any evidence of atherosclerosis in the aorta or the visceral branches.  Additional medical problems include essential hypertension which has generally been well treated and clonic hemifacial spasm (left blepharospasm) which has been frustratingly difficult to treat.  Cardiac medications have been stopped for periods of time to see if this will lead to improvement in blepharospasm, without significant change.  Nykolas's father had his first myocardial infarction at age 56 and had bypass surgery shortly thereafter.  He did smoke.  He died at age 12.  Eckhardt mother has diabetes mellitus and had a stroke in her late 66s.  Past Medical History:  Diagnosis Date   Diabetes    Hyperlipidemia     Hypertension     Past Surgical History:  Procedure Laterality Date   MASS EXCISION  06/17/2012   Procedure: MINOR EXCISION OF MASS;  Surgeon: Haywood Lasso, MD;  Location: [facility];  Service: General;  Laterality: N/A;  Removal Cyst of Right Upper Back    Current Medications: Current Meds  Medication Sig   amLODipine (NORVASC) 5 MG tablet Take 1 tablet (5 mg total) by mouth daily.   Blood Glucose Monitoring Suppl (FREESTYLE LITE) w/Device KIT Use to check blood sugars 3 times daily   cholecalciferol (VITAMIN D3) 25 MCG (1000 UNIT) tablet Take 2,000 Units by mouth daily.   fenofibrate (TRICOR) 48 MG tablet Take 1 tablet (48 mg total) by mouth daily.   glucose blood test strip Use to test blood sugars 3 times daily   icosapent Ethyl (VASCEPA) 1 g capsule Take 2 capsules (2 g total) by mouth 2 (two) times daily.   insulin glargine-yfgn (SEMGLEE, YFGN,) 100 UNIT/ML Pen Inject 22 Units into the skin daily.   insulin lispro (HUMALOG KWIKPEN) 100 UNIT/ML KwikPen Inject 2-12 Units into the skin 3 (three) times daily with meals, per sliding scale. BG=180-200: 2u; BG=201-250: 4u; BG=251-300: 6u; BG=3-1-350: 8u; BG=351-400: 10u; and BG=401-450: 12u.  Insulin Pen Needle (PENTIPS) 31G X 5 MM MISC use as directed to inject insulin up to 4 times per day   ketoconazole (NIZORAL) 2 % cream Apply 1 application topically 2 (two) times daily for 2 weeks.   Lancets (FREESTYLE) lancets Use as directed to test blood sugars 3 times daily   lisinopril (ZESTRIL) 5 MG tablet Take 1 tablet by mouth daily   Multiple Vitamins-Minerals (CENTURY PO) Take by mouth.   Current Facility-Administered Medications for the 12/18/22 encounter (Office Visit) with Cosby Proby, Dani Gobble, MD  Medication   Study - ESSENCE - olezarsen 50 mg, 80 mg or placebo SQ injection (PI-Hilty)     Allergies:   Statins   Social History   Socioeconomic History   Marital status: Married    Spouse name: Comptroller   Number of  children: Not on file   Years of education: Not on file   Highest education level: Not on file  Occupational History   Not on file  Tobacco Use   Smoking status: Never   Smokeless tobacco: Never  Vaping Use   Vaping Use: Never used  Substance and Sexual Activity   Alcohol use: Yes   Drug use: No   Sexual activity: Not on file  Other Topics Concern   Not on file  Social History Narrative   Pt's wife is an Therapist, sports in the [employer] system.   Social Determinants of Health   Financial Resource Strain: Not on file  Food Insecurity: No Food Insecurity (01/11/2022)   Hunger Vital Sign    Worried About Running Out of Food in the Last Year: Never true    Ran Out of Food in the Last Year: Never true  Transportation Needs: No Transportation Needs (01/11/2022)   PRAPARE - Hydrologist (Medical): No    Lack of Transportation (Non-Medical): No  Physical Activity: Not on file  Stress: Not on file  Social Connections: Not on file     Family History: The patient's family history includes Cancer in his mother; Heart disease in his father; Lymphoma in his mother.  ROS:   Please see the history of present illness.     All other systems reviewed and are negative.  EKGs/Labs/Other Studies Reviewed:    The following studies were reviewed today:   EKG:  EKG is ordered today.  It shows normal sinus rhythm and is a normal tracing with exception of borderline voltage criteria for LVH.    Recent Labs: 05/10/2022: Hemoglobin 14.2; Platelets 273 05/14/2022: ALT 38; BUN 25; Creatinine, Ser 1.13; Potassium 5.1; Sodium 137  Discharge labs from [facility] from 12/17/2021 Sodium 139, potassium 3.9, CO2 26, BUN 36, creatinine 2.30, glucose 211 WBC 11.9, hemoglobin 11.4, platelets 236 K  12/26/2021 Creatinine 1.17, hemoglobin 11.3  01/22/2022 hemoglobin A1c 7.2%  04/04/2022 hemoglobin A1c 5.5%  Recent Lipid Panel No results found for: "CHOL", "TRIG", "HDL", "CHOLHDL", "VLDL",  "LDLCALC", "LDLDIRECT" 2019  triglycerides 375, total cholesterol 189, HDL 37, LDL 77 and fasting glucose 109 2020  triglycerides 284, total cholesterol 205, HDL 36, LDL 112, fasting glucose 113 11/03/2020 triglycerides 2364, total cholesterol 300, HDL 11, fasting glucose 233.  Same lab assay his sodium was low at 121.  I am not sure if these labs were fasting, but suspect so. He was advised to go to the emergency department but he was asymptomatic and declined. 12/08/2021 triglycerides 2496, total cholesterol 680, HDL 12, direct LDL 62 02/21/2022 triglycerides 170, cholesterol 197, HDL 42, LDL  125  Risk Assessment/Calculations:           Physical Exam:    VS:  BP 124/76   Pulse 72   Ht 6' (1.829 m)   Wt 218 lb 3.2 oz (99 kg)   SpO2 99%   BMI 29.59 kg/m     Wt Readings from Last 3 Encounters:  12/18/22 218 lb 3.2 oz (99 kg)  12/06/22 221 lb (100.2 kg)  09/26/22 200 lb (90.7 kg)      General: Alert, oriented x3, no distress, overweight, but appears fit Head: no evidence of trauma, PERRL, EOMI, no exophtalmos or lid lag, no myxedema, no xanthelasma; normal ears, nose and oropharynx Neck: normal jugular venous pulsations and no hepatojugular reflux; brisk carotid pulses without delay and no carotid bruits Chest: clear to auscultation, no signs of consolidation by percussion or palpation, normal fremitus, symmetrical and full respiratory excursions Cardiovascular: normal position and quality of the apical impulse, regular rhythm, normal first and second heart sounds, no murmurs, rubs or gallops Abdomen: no tenderness or distention, no masses by palpation, no abnormal pulsatility or arterial bruits, normal bowel sounds, no hepatosplenomegaly Extremities: no clubbing, cyanosis or edema; 2+ radial, ulnar and brachial pulses bilaterally; 2+ right femoral, posterior tibial and dorsalis pedis pulses; 2+ left femoral, posterior tibial and dorsalis pedis pulses; no subclavian or femoral  bruits Neurological: grossly nonfocal Psych: Normal mood and affect'   ASSESSMENT:    1. Mixed hyperlipidemia   2. LADA (latent autoimmune diabetes in adults), managed as type 1   3. Essential hypertension   4. Statin myopathy     PLAN:    In order of problems listed above:  Mixed hyperlipidemia: Rob has longstanding evidence of an insulin resistance/highly atherogenic lipid profile, but has had a remarkable improvement in all his metabolic parameters.  He had previous problems with abnormal LFTs on statin and last August developed evidence of statin myopathy/CK elevation while on combination rosuvastatin and fibrate.  The statin has been discontinued.  Adequate treatment of his hypertriglyceridemia I think takes preference in view of his previous problems with pancreatitis.  Continue fibrate and Vascepa.  Rechecking CK today.  Consider adding ezetimibe and rechecking CK levels and lipid profile in a few weeks.   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  Exceptional hemoglobin A1c 5.7% when last checked in November.  No symptomatic hypoglycemia events. AKI: Most recent creatinine is essentially back to normal at 1.1.  On ACE inhibitor. HTN: Excellent control on very low-dose lisinopril plus amlodipine Increased risk of CAD: Despite numerous coronary risk factors Rob is completely asymptomatic and had a normal ECG treadmill stress test with excellent exercise capacity.  The focus is on risk factor modification.  Imaging studies do not appear to be necessary at this time.  I think a calcium score will be very difficult to interpret in view of his frequent attempts at treatment of his hyperlipidemia in the past.  His kidney function has improved substantially and if necessary, we can perform coronary CT angiography or invasive  angiography in the future, if symptoms develop.    Medication Adjustments/Labs and Tests Ordered: Current medicines are reviewed at length with the patient today.  Concerns regarding medicines are outlined above.  Orders Placed This Encounter  Procedures   EKG 12-Lead   No  orders of the defined types were placed in this encounter.   Patient Instructions  Medication Instructions:  No changes *If you need a refill on your cardiac medications before your next appointment, please call your pharmacy*  Follow-Up: At [facility], you and your health needs are our priority.  As part of our continuing mission to provide you with exceptional heart care, we have created designated Provider Care Teams.  These Care Teams include your primary Cardiologist (physician) and Advanced Practice Providers (APPs -  Physician Assistants and Nurse Practitioners) who all work together to provide you with the care you need, when you need it.  We recommend signing up for the patient portal called "MyChart".  Sign up information is provided on this After Visit Summary.  MyChart is used to connect with patients for Virtual Visits (Telemedicine).  Patients are able to view lab/test results, encounter notes, upcoming appointments, etc.  Non-urgent messages can be sent to your provider as well.   To learn more about what you can do with MyChart, go to [email or web address].    Your next appointment:   1 year(s)  Provider:   Sanda Klein, MD       Signed, Sanda Klein, MD  12/18/2022 10:52 AM    [place]

## 2022-12-18 NOTE — Patient Instructions (Signed)
Medication Instructions:  No changes *If you need a refill on your cardiac medications before your next appointment, please call your pharmacy*  Follow-Up: At [facility], you and your health needs are our priority.  As part of our continuing mission to provide you with exceptional heart care, we have created designated Provider Care Teams.  These Care Teams include your primary Cardiologist (physician) and Advanced Practice Providers (APPs -  Physician Assistants and Nurse Practitioners) who all work together to provide you with the care you need, when you need it.  We recommend signing up for the patient portal called "MyChart".  Sign up information is provided on this After Visit Summary.  MyChart is used to connect with patients for Virtual Visits (Telemedicine).  Patients are able to view lab/test results, encounter notes, upcoming appointments, etc.  Non-urgent messages can be sent to your provider as well.   To learn more about what you can do with MyChart, go to [email or web address].    Your next appointment:   1 year(s)  Provider:   Mihai Croitoru, MD     

## 2022-12-24 ENCOUNTER — Other Ambulatory Visit: Payer: Self-pay

## 2022-12-24 ENCOUNTER — Other Ambulatory Visit (HOSPITAL_BASED_OUTPATIENT_CLINIC_OR_DEPARTMENT_OTHER): Payer: Self-pay

## 2022-12-24 ENCOUNTER — Encounter: Payer: Self-pay | Admitting: Cardiovascular Disease

## 2022-12-24 MED ORDER — LISINOPRIL 5 MG PO TABS
5.0000 mg | ORAL_TABLET | Freq: Every day | ORAL | 3 refills | Status: DC
  Filled 2022-12-24: qty 90, 90d supply, fill #0
  Filled 2023-04-01: qty 90, 90d supply, fill #1
  Filled 2023-06-26: qty 90, 90d supply, fill #2
  Filled 2023-10-02: qty 90, 90d supply, fill #3

## 2023-01-07 ENCOUNTER — Other Ambulatory Visit (HOSPITAL_BASED_OUTPATIENT_CLINIC_OR_DEPARTMENT_OTHER): Payer: Self-pay

## 2023-01-14 ENCOUNTER — Encounter: Payer: Self-pay | Admitting: *Deleted

## 2023-01-14 DIAGNOSIS — Z006 Encounter for examination for normal comparison and control in clinical research program: Secondary | ICD-10-CM

## 2023-01-14 NOTE — Research (Signed)
Charles [name] called and given parking code. He states he might be  a little late. Told him to come when ever he can. Voices understanding.

## 2023-01-15 ENCOUNTER — Other Ambulatory Visit (HOSPITAL_BASED_OUTPATIENT_CLINIC_OR_DEPARTMENT_OTHER): Payer: Self-pay

## 2023-01-15 ENCOUNTER — Other Ambulatory Visit: Payer: Self-pay

## 2023-01-15 ENCOUNTER — Encounter: Payer: BC Managed Care – PPO | Admitting: *Deleted

## 2023-01-15 DIAGNOSIS — Z006 Encounter for examination for normal comparison and control in clinical research program: Secondary | ICD-10-CM

## 2023-01-15 MED ORDER — STUDY - ESSENCE - OLEZARSEN 50 MG, 80 MG OR PLACEBO SQ INJECTION (PI-HILTY)
80.0000 mg | INJECTION | SUBCUTANEOUS | Status: DC
  Administered 2023-01-15: 80 mg via SUBCUTANEOUS
  Filled 2023-01-15: qty 0.8

## 2023-01-15 NOTE — Research (Signed)
     TREATMENT DAY 281 - STUDY WEEK 41    Subject Number:  [id number]             Randomization [id number]           Date:15-Jan-2023      [x] Vital Signs Collected - Blood Pressure:127/89 - Heart Rate:89 - Respiratory Rate:18 - Temperature:98.7 - Oxygen Saturation:97%   [x]   (abdominal pain only) since last visit  [x]  Assessment of ER Visits, Hospitalizations, and Inpatient Days  [x]  Adverse Events and Concomitant Medications  [x]  Diet, Lifestyle, and Alcohol Counseling   [x]  Study Drug: Garden Injection   Charles [name] is here for Week 41 Day 281 of Essence research. He reports no abd pain, no visits to the Ed or Urgent care, and no changes in his meds. VS taken at 0953. Scheduled next visit for May 28 at 1000. Injection given in right lower abd at 1039 tol well kit number [id number]   Current Outpatient Medications:    amLODipine (NORVASC) 5 MG tablet, Take 1 tablet (5 mg total) by mouth daily., Disp: 30 tablet, Rfl: 11   Blood Glucose Monitoring Suppl (FREESTYLE LITE) w/Device KIT, Use to check blood sugars 3 times daily, Disp: 1 kit, Rfl: 0   cholecalciferol (VITAMIN D3) 25 MCG (1000 UNIT) tablet, Take 2,000 Units by mouth daily., Disp: , Rfl:    fenofibrate (TRICOR) 48 MG tablet, Take 1 tablet (48 mg total) by mouth daily., Disp: 90 tablet, Rfl: 3   glucose blood test strip, Use to test blood sugars 3 times daily, Disp: 300 each, Rfl: 5   icosapent Ethyl (VASCEPA) 1 g capsule, Take 2 capsules (2 g total) by mouth 2 (two) times daily., Disp: 120 capsule, Rfl: 11   insulin glargine-yfgn (SEMGLEE, YFGN,) 100 UNIT/ML Pen, Inject 22 Units into the skin daily., Disp: 30 mL, Rfl: 3   insulin lispro (HUMALOG KWIKPEN) 100 UNIT/ML KwikPen, Inject 2-12 Units into the skin 3 (three) times daily with meals, per sliding scale. BG=180-200: 2u; BG=201-250: 4u; BG=251-300: 6u; BG=3-1-350: 8u; BG=351-400: 10u; and BG=401-450: 12u., Disp: 30 mL, Rfl: 2   Insulin Pen Needle (PENTIPS) 31G X 5 MM MISC, use  as directed to inject insulin up to 4 times per day, Disp: 100 each, Rfl: 11   ketoconazole (NIZORAL) 2 % cream, Apply 1 application topically 2 (two) times daily for 2 weeks., Disp: 30 g, Rfl: 2   Lancets (FREESTYLE) lancets, Use as directed to test blood sugars 3 times daily, Disp: 300 each, Rfl: 5   lisinopril (ZESTRIL) 5 MG tablet, Take 1 tablet by mouth daily, Disp: 90 tablet, Rfl: 3   Multiple Vitamins-Minerals (CENTURY PO), Take by mouth., Disp: , Rfl:   Current Facility-Administered Medications:    Study - ESSENCE - olezarsen 50 mg, 80 mg or placebo SQ injection (PI-Hilty), 80 mg, Subcutaneous, Q28 days, Hilty, Lisette Abu, MD

## 2023-01-21 DIAGNOSIS — E1122 Type 2 diabetes mellitus with diabetic chronic kidney disease: Secondary | ICD-10-CM | POA: Diagnosis not present

## 2023-01-21 DIAGNOSIS — E7800 Pure hypercholesterolemia, unspecified: Secondary | ICD-10-CM | POA: Diagnosis not present

## 2023-01-21 DIAGNOSIS — I10 Essential (primary) hypertension: Secondary | ICD-10-CM | POA: Diagnosis not present

## 2023-01-21 DIAGNOSIS — Z125 Encounter for screening for malignant neoplasm of prostate: Secondary | ICD-10-CM | POA: Diagnosis not present

## 2023-01-21 LAB — LAB REPORT - SCANNED
EGFR (African American): 76.9
EGFR (Non-African Amer.): 63.6
PSA, Total: 1.041

## 2023-01-24 ENCOUNTER — Other Ambulatory Visit (HOSPITAL_BASED_OUTPATIENT_CLINIC_OR_DEPARTMENT_OTHER): Payer: Self-pay

## 2023-01-28 DIAGNOSIS — Z1339 Encounter for screening examination for other mental health and behavioral disorders: Secondary | ICD-10-CM | POA: Diagnosis not present

## 2023-01-28 DIAGNOSIS — N182 Chronic kidney disease, stage 2 (mild): Secondary | ICD-10-CM | POA: Diagnosis not present

## 2023-01-28 DIAGNOSIS — Z1331 Encounter for screening for depression: Secondary | ICD-10-CM | POA: Diagnosis not present

## 2023-01-28 DIAGNOSIS — Z0000 Encounter for general adult medical examination without abnormal findings: Secondary | ICD-10-CM | POA: Diagnosis not present

## 2023-01-28 DIAGNOSIS — I129 Hypertensive chronic kidney disease with stage 1 through stage 4 chronic kidney disease, or unspecified chronic kidney disease: Secondary | ICD-10-CM | POA: Diagnosis not present

## 2023-01-28 DIAGNOSIS — E1122 Type 2 diabetes mellitus with diabetic chronic kidney disease: Secondary | ICD-10-CM | POA: Diagnosis not present

## 2023-01-28 DIAGNOSIS — E118 Type 2 diabetes mellitus with unspecified complications: Secondary | ICD-10-CM | POA: Diagnosis not present

## 2023-02-05 ENCOUNTER — Other Ambulatory Visit: Payer: Self-pay | Admitting: Cardiovascular Disease

## 2023-02-05 ENCOUNTER — Other Ambulatory Visit (HOSPITAL_BASED_OUTPATIENT_CLINIC_OR_DEPARTMENT_OTHER): Payer: Self-pay

## 2023-02-05 ENCOUNTER — Encounter: Payer: Self-pay | Admitting: Cardiovascular Disease

## 2023-02-05 ENCOUNTER — Telehealth: Payer: Self-pay | Admitting: Cardiovascular Disease

## 2023-02-05 MED ORDER — ICOSAPENT ETHYL 1 G PO CAPS
2.0000 g | ORAL_CAPSULE | Freq: Two times a day (BID) | ORAL | 11 refills | Status: DC
  Filled 2023-02-05: qty 120, 30d supply, fill #0
  Filled 2023-03-12: qty 120, 30d supply, fill #1
  Filled 2023-04-12: qty 120, 30d supply, fill #2
  Filled 2023-05-16: qty 120, 30d supply, fill #3
  Filled 2023-06-15: qty 120, 30d supply, fill #4
  Filled 2023-07-24 – 2023-07-29 (×2): qty 120, 30d supply, fill #5
  Filled 2023-09-03: qty 120, 30d supply, fill #6
  Filled 2023-10-02: qty 120, 30d supply, fill #7
  Filled 2023-11-01: qty 120, 30d supply, fill #8
  Filled 2023-11-28: qty 120, 30d supply, fill #9
  Filled 2023-12-28: qty 120, 30d supply, fill #10
  Filled 2024-01-31: qty 120, 30d supply, fill #11

## 2023-02-05 NOTE — Telephone Encounter (Signed)
Paper Work Dropped Off: lab results from endocrinologist  Date:5.21.24  Location of paper:  MD box

## 2023-02-06 ENCOUNTER — Other Ambulatory Visit (HOSPITAL_BASED_OUTPATIENT_CLINIC_OR_DEPARTMENT_OTHER): Payer: Self-pay

## 2023-02-08 ENCOUNTER — Other Ambulatory Visit: Payer: Self-pay | Admitting: Emergency Medicine

## 2023-02-08 DIAGNOSIS — E782 Mixed hyperlipidemia: Secondary | ICD-10-CM

## 2023-02-08 DIAGNOSIS — Z79899 Other long term (current) drug therapy: Secondary | ICD-10-CM

## 2023-02-12 ENCOUNTER — Other Ambulatory Visit: Payer: Self-pay

## 2023-02-12 ENCOUNTER — Encounter: Payer: BC Managed Care – PPO | Admitting: *Deleted

## 2023-02-12 DIAGNOSIS — Z006 Encounter for examination for normal comparison and control in clinical research program: Secondary | ICD-10-CM

## 2023-02-12 MED ORDER — STUDY - ESSENCE - OLEZARSEN 50 MG, 80 MG OR PLACEBO SQ INJECTION (PI-HILTY)
80.0000 mg | INJECTION | SUBCUTANEOUS | Status: DC
  Administered 2023-02-12: 80 mg via SUBCUTANEOUS
  Filled 2023-02-12: qty 0.8

## 2023-02-12 NOTE — Research (Signed)
     TREATMENT DAY 309 - STUDY WEEK 45    Subject Number: [id number]             Randomization Number: [id number]         Date:12-Feb-2023      [x] Vital Signs Collected - Blood Pressure: 134/88 - Heart Rate:73 - Respiratory Rate:16 - Temperature:98.1 - Oxygen Saturation: 97%   [x]   (abdominal pain only) since last visit  [x]  Assessment of ER Visits, Hospitalizations, and Inpatient Days  [x]  Adverse Events and Concomitant Medications  [x]  Diet, Lifestyle, and Alcohol Counseling   [x]  Study Drug: [place] Injection    Charles [name] is here for Week 45 day 309 of Essence research. He reports no abd pain, no changes in his meds, and no visits to the Ed or Urgent care since last seen. Vs taken at 1005. Scheduled next visit for June 26 at 1000. Injection given in left lower abd at 1034 tol well kit number [id number].   Current Outpatient Medications:    amLODipine (NORVASC) 5 MG tablet, Take 1 tablet (5 mg total) by mouth daily., Disp: 30 tablet, Rfl: 11   Blood Glucose Monitoring Suppl (FREESTYLE LITE) w/Device KIT, Use to check blood sugars 3 times daily, Disp: 1 kit, Rfl: 0   cholecalciferol (VITAMIN D3) 25 MCG (1000 UNIT) tablet, Take 2,000 Units by mouth daily., Disp: , Rfl:    fenofibrate (TRICOR) 48 MG tablet, Take 1 tablet (48 mg total) by mouth daily., Disp: 90 tablet, Rfl: 3   glucose blood test strip, Use to test blood sugars 3 times daily, Disp: 300 each, Rfl: 5   icosapent Ethyl (VASCEPA) 1 g capsule, Take 2 capsules (2 g total) by mouth 2 (two) times daily., Disp: 120 capsule, Rfl: 11   insulin glargine-yfgn (SEMGLEE, YFGN,) 100 UNIT/ML Pen, Inject 22 Units into the skin daily., Disp: 30 mL, Rfl: 3   insulin lispro (HUMALOG KWIKPEN) 100 UNIT/ML KwikPen, Inject 2-12 Units into the skin 3 (three) times daily with meals, per sliding scale. BG=180-200: 2u; BG=201-250: 4u; BG=251-300: 6u; BG=3-1-350: 8u; BG=351-400: 10u; and BG=401-450: 12u., Disp: 30 mL, Rfl: 2   Insulin Pen Needle (PENTIPS)  31G X 5 MM MISC, use as directed to inject insulin up to 4 times per day, Disp: 100 each, Rfl: 11   ketoconazole (NIZORAL) 2 % cream, Apply 1 application topically 2 (two) times daily for 2 weeks., Disp: 30 g, Rfl: 2   Lancets (FREESTYLE) lancets, Use as directed to test blood sugars 3 times daily, Disp: 300 each, Rfl: 5   lisinopril (ZESTRIL) 5 MG tablet, Take 1 tablet by mouth daily, Disp: 90 tablet, Rfl: 3   Multiple Vitamins-Minerals (CENTURY PO), Take by mouth., Disp: , Rfl:   Current Facility-Administered Medications:    Study - ESSENCE - olezarsen 50 mg, 80 mg or placebo SQ injection (PI-Hilty), 80 mg, Subcutaneous, Q28 days, Chrystie Nose, MD, 80 mg at 02/12/23 1034

## 2023-02-18 ENCOUNTER — Other Ambulatory Visit: Payer: Self-pay

## 2023-02-18 ENCOUNTER — Other Ambulatory Visit (HOSPITAL_BASED_OUTPATIENT_CLINIC_OR_DEPARTMENT_OTHER): Payer: Self-pay

## 2023-02-18 MED ORDER — TECHLITE PEN NEEDLES 31G X 5 MM MISC
1.0000 | Freq: Four times a day (QID) | 11 refills | Status: DC
  Filled 2023-02-18: qty 100, 25d supply, fill #0
  Filled 2023-04-01: qty 100, 25d supply, fill #1
  Filled 2023-04-15 – 2023-04-25 (×2): qty 100, 25d supply, fill #2
  Filled 2023-05-25: qty 100, 25d supply, fill #3
  Filled 2023-06-26: qty 100, 25d supply, fill #4
  Filled 2023-07-12 – 2023-07-15 (×5): qty 100, 25d supply, fill #5
  Filled 2023-08-12: qty 100, 25d supply, fill #6
  Filled 2023-10-02: qty 100, 25d supply, fill #7
  Filled 2023-11-11: qty 100, 25d supply, fill #8
  Filled 2023-12-16: qty 100, 25d supply, fill #9
  Filled 2024-01-20: qty 100, 25d supply, fill #10

## 2023-02-18 NOTE — Progress Notes (Unsigned)
Office Visit    Patient Name: Charles [name] Date of Encounter: 02/19/2023  Primary Care Provider:  Gaspar Garbe, MD Primary Cardiologist:  Thurmon Fair, MD  Chief Complaint    Hyperlipidemia   Significant Past Medical History   Hypertriglyceridemia In Essence trial, on fenofibrate and vascepa  HTN Mostly controlled on current medications  DM2 5/24 A1c 5.9, was as high as 14.6 (11/2021) on      Allergies  Allergen Reactions   Statins Other (See Comments)    Myopathies - elevated CK    History of Present Illness    Charles [name] is a 52 y.o. male patient of Dr Royann Shivers, in the office today for management of hyperlipidemia.  He had previously been on rosuvastatin 20 mg until last August.  He had labs drawn as part of the ESSENCE trial and it was noted his CK was elevated.  Dr. Rennis Golden reached out to him and sent him to the ED.  Labs there showed his CK at 3626.  Interestingly, he had no symptoms at the time.    Insurance Carrier:  [employer] - commercial  LDL Cholesterol goal:  LDL < 55  Current Medications:  fenofibrate, vascepa, ESSENCE trial for triglycerides  Previously tried: rosuvastatin cause elevation in CK to 3626  Family Hx:   father had MI at 34, with CABG, died in Mar 09, 2000 at 27; mother has DM, now 31 mgm lived to 95; pgf died in his 63's from MI; sister has kidney issues; 3 children  34, 44, 80 -all healthy  Social Hx: Tobacco: no Alcohol: no      Diet:  more eating out because busy at work; tries to find good choices    Exercise: 20 minutes most days; gym 3 days per week; more cardio (eliptical x 30 min, machines)   Accessory Clinical Findings   01/21/2023 KPN:  TC 227, TG 84, HDL 49, LDL 161  Lab Results  Component Value Date   ALT 38 05/14/2022   AST 31 05/14/2022   ALKPHOS 50 05/14/2022   BILITOT 0.5 05/14/2022   Lab Results  Component Value Date   CREATININE 1.13 05/14/2022   BUN 25 (H) 05/14/2022   NA 137 05/14/2022   K 5.1 05/14/2022   CL  100 05/14/2022   CO2 22 05/14/2022   No results found for: "HGBA1C"  Home Medications    Current Outpatient Medications  Medication Sig Dispense Refill   amLODipine (NORVASC) 5 MG tablet Take 1 tablet (5 mg total) by mouth daily. 30 tablet 11   Blood Glucose Monitoring Suppl (FREESTYLE LITE) w/Device KIT Use to check blood sugars 3 times daily 1 kit 0   cholecalciferol (VITAMIN D3) 25 MCG (1000 UNIT) tablet Take 2,000 Units by mouth daily.     fenofibrate (TRICOR) 48 MG tablet Take 1 tablet (48 mg total) by mouth daily. 90 tablet 3   glucose blood test strip Use to test blood sugars 3 times daily 300 each 5   icosapent Ethyl (VASCEPA) 1 g capsule Take 2 capsules (2 g total) by mouth 2 (two) times daily. 120 capsule 11   insulin glargine-yfgn (SEMGLEE, YFGN,) 100 UNIT/ML Pen Inject 22 Units into the skin daily. 30 mL 3   insulin lispro (HUMALOG KWIKPEN) 100 UNIT/ML KwikPen Inject 2-12 Units into the skin 3 (three) times daily with meals, per sliding scale. BG=180-200: 2u; BG=201-250: 4u; BG=251-300: 6u; BG=3-1-350: 8u; BG=351-400: 10u; and BG=401-450: 12u. 30 mL 2   Insulin Pen Needle (  TECHLITE PEN NEEDLES) 31G X 5 MM MISC Use as directed to inject insulin up to 4 times a day. 100 each 11   ketoconazole (NIZORAL) 2 % cream Apply 1 application topically 2 (two) times daily for 2 weeks. 30 g 2   Lancets (FREESTYLE) lancets Use as directed to test blood sugars 3 times daily 300 each 5   lisinopril (ZESTRIL) 5 MG tablet Take 1 tablet by mouth daily 90 tablet 3   Multiple Vitamins-Minerals (CENTURY PO) Take by mouth.     Current Facility-Administered Medications  Medication Dose Route Frequency Provider Last Rate Last Admin   Study - ESSENCE - olezarsen 50 mg, 80 mg or placebo SQ injection (PI-Hilty)  80 mg Subcutaneous Q28 days Chrystie Nose, MD   80 mg at 02/12/23 1034     Assessment & Plan    Hyperlipidemia LDL goal <130 Assessment: Patient with hyperlipidemia not at LDL goal of <  55 Most recent LDL 161 on 01/21/23 Not able to tolerate statins secondary to elevated CK  Reviewed options for lowering LDL cholesterol, including ezetimibe, PCSK-9 inhibitors, bempedoic acid and inclisiran.  Discussed mechanisms of action, dosing, side effects, potential decreases in LDL cholesterol and costs.  Also reviewed potential options for patient assistance.  Plan: Patient agreeable to starting Repatha 140 mg q14d Repeat labs after:  3 months Lipid Liver function Patient was given information on co-pay card from manufacturer   Phillips Hay, PharmD CPP [facility] [address]  [place], [place] [address] [phone]  02/19/2023, 1:58 PM

## 2023-02-19 ENCOUNTER — Encounter: Payer: Self-pay | Admitting: Pharmacist Clinician (PhC)/ Clinical Pharmacy Specialist

## 2023-02-19 ENCOUNTER — Telehealth: Payer: Self-pay | Admitting: Pharmacist Clinician (PhC)/ Clinical Pharmacy Specialist

## 2023-02-19 ENCOUNTER — Other Ambulatory Visit (HOSPITAL_COMMUNITY): Payer: Self-pay

## 2023-02-19 ENCOUNTER — Ambulatory Visit
Payer: BC Managed Care – PPO | Attending: Cardiology | Admitting: Pharmacist Clinician (PhC)/ Clinical Pharmacy Specialist

## 2023-02-19 ENCOUNTER — Telehealth: Payer: Self-pay

## 2023-02-19 DIAGNOSIS — E785 Hyperlipidemia, unspecified: Secondary | ICD-10-CM | POA: Diagnosis not present

## 2023-02-19 NOTE — Assessment & Plan Note (Addendum)
Assessment: Patient with hyperlipidemia not at LDL goal of < 55 Most recent LDL 161 on 01/21/23 Not able to tolerate statins secondary to elevated CK  Reviewed options for lowering LDL cholesterol, including ezetimibe, PCSK-9 inhibitors, bempedoic acid and inclisiran.  Discussed mechanisms of action, dosing, side effects, potential decreases in LDL cholesterol and costs.  Also reviewed potential options for patient assistance.  Plan: Patient agreeable to starting Repatha 140 mg q14d Repeat labs after:  3 months Lipid Liver function Patient was given information on co-pay card from manufacturer

## 2023-02-19 NOTE — Telephone Encounter (Signed)
Please see if PA is needed for Repatha - he doesn't have any ASCVD that I can find and LDL is not familial (although close)

## 2023-02-19 NOTE — Patient Instructions (Signed)
Your Results:             Your most recent labs Goal  Total Cholesterol 227 < 200  Triglycerides 84 < 150  HDL (happy/good cholesterol) 49 > 40  LDL (lousy/bad cholesterol 161 < 55   Medication changes:  We will start the process to get Repatha covered by your insurance.  Once the prior authorization is complete, I will call/send a MyChart message to let you know and confirm pharmacy information.   You will take one injection every 14 days.  Lab orders:  We want to repeat labs after 2-3 months.  We will send you a lab order to remind you once we get closer to that time.      Thank you for choosing [facility]

## 2023-02-27 ENCOUNTER — Other Ambulatory Visit (HOSPITAL_BASED_OUTPATIENT_CLINIC_OR_DEPARTMENT_OTHER): Payer: Self-pay

## 2023-02-27 MED ORDER — GLUCOSE BLOOD VI STRP
ORAL_STRIP | 5 refills | Status: AC
  Filled 2023-02-27: qty 300, 100d supply, fill #0
  Filled 2023-06-15: qty 300, 100d supply, fill #1
  Filled 2023-09-20: qty 300, 100d supply, fill #2
  Filled 2023-12-29: qty 300, 100d supply, fill #3

## 2023-03-01 NOTE — Telephone Encounter (Signed)
Can you check on this one?

## 2023-03-04 ENCOUNTER — Other Ambulatory Visit (HOSPITAL_COMMUNITY): Payer: Self-pay

## 2023-03-04 ENCOUNTER — Telehealth: Payer: Self-pay

## 2023-03-04 NOTE — Telephone Encounter (Signed)
Pharmacy Patient Advocate Encounter   Received notification from [facility] that prior authorization for REPATHA is needed.    PA submitted on 03/04/23 Key [id number] Status is pending  Haze Rushing, CPhT Pharmacy Patient Advocate Specialist Direct Number: [phone] Fax: [phone]

## 2023-03-04 NOTE — Telephone Encounter (Signed)
Pharmacy Patient Advocate Encounter  Prior Authorization for REPATHA has been approved.    Effective dates: 03/04/23 through 03/03/24  Jennelle Pinkstaff, CPhT Pharmacy Patient Advocate Specialist Direct Number: [phone] Fax: [phone] 

## 2023-03-04 NOTE — Telephone Encounter (Signed)
Hi Kristen! I started another PA. Please see separate encounter for updates on determination. (I will route you back in once a decision has been made)  Haze Rushing, CPhT Pharmacy Patient Advocate Specialist Direct Number: [phone] Fax: [phone]

## 2023-03-05 ENCOUNTER — Other Ambulatory Visit (HOSPITAL_BASED_OUTPATIENT_CLINIC_OR_DEPARTMENT_OTHER): Payer: Self-pay

## 2023-03-05 MED ORDER — REPATHA SURECLICK 140 MG/ML ~~LOC~~ SOAJ
140.0000 mg | SUBCUTANEOUS | 3 refills | Status: DC
  Filled 2023-03-05: qty 6, 84d supply, fill #0
  Filled 2023-05-25: qty 6, 84d supply, fill #1
  Filled 2023-08-08: qty 6, 84d supply, fill #2
  Filled 2023-11-11: qty 6, 84d supply, fill #3

## 2023-03-05 NOTE — Addendum Note (Signed)
Addended by: Rosalee Kaufman on: 03/05/2023 06:54 AM   Modules accepted: Orders

## 2023-03-05 NOTE — Telephone Encounter (Signed)
PA approved, see other encounter.

## 2023-03-13 ENCOUNTER — Encounter: Payer: BC Managed Care – PPO | Admitting: *Deleted

## 2023-03-13 DIAGNOSIS — Z006 Encounter for examination for normal comparison and control in clinical research program: Secondary | ICD-10-CM

## 2023-03-13 MED ORDER — STUDY - ESSENCE - OLEZARSEN 50 MG, 80 MG OR PLACEBO SQ INJECTION (PI-HILTY)
80.0000 mg | INJECTION | SUBCUTANEOUS | Status: DC
  Administered 2023-03-13: 80 mg via SUBCUTANEOUS
  Filled 2023-03-13: qty 0.8

## 2023-03-13 NOTE — Research (Addendum)
CS9  Patient doing well.  No complaints, no urgent care, no er visits, no hospitalizations. No abdominal pain. Meds reviewed with patient   IP given  Left lower abdomen @ 1050 Kit # [id number]  Next visit scheduled for 04/09/2023 @ 1000   Current Outpatient Medications:    amLODipine (NORVASC) 5 MG tablet, Take 1 tablet (5 mg total) by mouth daily., Disp: 30 tablet, Rfl: 11   Blood Glucose Monitoring Suppl (FREESTYLE LITE) w/Device KIT, Use to check blood sugars 3 times daily, Disp: 1 kit, Rfl: 0   cholecalciferol (VITAMIN D3) 25 MCG (1000 UNIT) tablet, Take 2,000 Units by mouth daily., Disp: , Rfl:    Evolocumab (REPATHA SURECLICK) 140 MG/ML SOAJ, Inject 140 mg into the skin every 14 (fourteen) days., Disp: 6 mL, Rfl: 3   fenofibrate (TRICOR) 48 MG tablet, Take 1 tablet (48 mg total) by mouth daily., Disp: 90 tablet, Rfl: 3   glucose blood test strip, Use to check blood sugars 3 times a day, Disp: 300 each, Rfl: 5   icosapent Ethyl (VASCEPA) 1 g capsule, Take 2 capsules (2 g total) by mouth 2 (two) times daily., Disp: 120 capsule, Rfl: 11   insulin glargine-yfgn (SEMGLEE, YFGN,) 100 UNIT/ML Pen, Inject 22 Units into the skin daily., Disp: 30 mL, Rfl: 3   insulin lispro (HUMALOG KWIKPEN) 100 UNIT/ML KwikPen, Inject 2-12 Units into the skin 3 (three) times daily with meals, per sliding scale. BG=180-200: 2u; BG=201-250: 4u; BG=251-300: 6u; BG=3-1-350: 8u; BG=351-400: 10u; and BG=401-450: 12u., Disp: 30 mL, Rfl: 2   Insulin Pen Needle (TECHLITE PEN NEEDLES) 31G X 5 MM MISC, Use as directed to inject insulin up to 4 times a day., Disp: 100 each, Rfl: 11   Lancets (FREESTYLE) lancets, Use as directed to test blood sugars 3 times daily, Disp: 300 each, Rfl: 5   lisinopril (ZESTRIL) 5 MG tablet, Take 1 tablet by mouth daily, Disp: 90 tablet, Rfl: 3   Multiple Vitamins-Minerals (CENTURY PO), Take by mouth., Disp: , Rfl:    ketoconazole (NIZORAL) 2 % cream, Apply 1 application topically 2 (two) times  daily for 2 weeks. (Patient not taking: Reported on 03/13/2023), Disp: 30 g, Rfl: 2  Current Facility-Administered Medications:    Study - ESSENCE - olezarsen 50 mg, 80 mg or placebo SQ injection (PI-Hilty), 80 mg, Subcutaneous, Q28 days, Hilty, Lisette Abu, MD, 80 mg at 03/13/23 1050

## 2023-04-01 ENCOUNTER — Other Ambulatory Visit (HOSPITAL_BASED_OUTPATIENT_CLINIC_OR_DEPARTMENT_OTHER): Payer: Self-pay

## 2023-04-08 ENCOUNTER — Encounter: Payer: Self-pay | Admitting: *Deleted

## 2023-04-08 DIAGNOSIS — Z006 Encounter for examination for normal comparison and control in clinical research program: Secondary | ICD-10-CM

## 2023-04-08 NOTE — Research (Signed)
Message left to remind Charles [name] of his appointment tomorrow at 1000, also gave him the parking code.

## 2023-04-09 ENCOUNTER — Other Ambulatory Visit: Payer: Self-pay

## 2023-04-09 ENCOUNTER — Encounter: Payer: BC Managed Care – PPO | Admitting: *Deleted

## 2023-04-09 VITALS — BP 127/87 | HR 77 | Temp 98.6°F | Resp 18

## 2023-04-09 DIAGNOSIS — Z006 Encounter for examination for normal comparison and control in clinical research program: Secondary | ICD-10-CM

## 2023-04-09 NOTE — Research (Addendum)
Charles [name] Essence week 53 Day 365 09-Apr-2023                Chemistry: BUN 25 mg/dL                               [] Clinically Significant  [x] Not Clinically Significant  Glucose  117   mg/dL                     [] Clinically Significant  [x] Not Clinically Significant Creatine Kinase (Ck) 304 U/L        [] Clinically Significant  [x] Not Clinically Significant Hs-C Reactive Protein 5.1 mg/L     [] Clinically Significant  [x] Not Clinically Significant Hemoglobin A1c 6.1                       [] Clinically Significant  [x] Not Clinically Significant    Urinalysis: Protein 10 mg/dL                           [] Clinically Significant  [x] Not Clinically Significant       Squamous Epithelial Cells  Occ    [] Clinically Significant  [x] Not Clinically Significant      Urine Chemistry: Urine Albumin 8.32 mg/dL                   [] Clinically Significant  [x] Not Clinically Significant Albumin Creatinine Ratio 127 mg/g     [] Clinically Significant  [x] Not Clinically Significant      Any further action needed to be taken per the PI? No  Chrystie Nose, MD, [facility], FACP  [place]  [facility]  Medical Director of the Advanced Lipid Disorders &  Cardiovascular Risk Reduction Clinic Diplomate of the American Board of Clinical Lipidology Attending Cardiologist  Direct Dial: [phone]  Fax: [phone]  Website:  [email or web address]      TREATMENT DAY 365 - STUDY WEEK 53  Subject Number: [id number]              Randomization Number:  [id number]           Date: 09-April-2023       [x] Vital Signs Collected - Blood Pressure:127/87 - Weight:216.2 - Heart Rate:77 - Respiratory Rate:18 - Temperature:98.6 - Oxygen Saturation:94%  [x]  Physical Exam Completed by PI or SUB-I  [x]  12-lead ECG  []  Pregnancy Test (If applicable)  [x]  Extended Urinalysis   [x]  Lab collection per protocol  [x]   (abdominal pain only) since last visit  [x]  Assessment of ER Visits,  Hospitalizations, and Inpatient Days  [x]  Adverse Events and Concomitant Medications  [x]  Diet, Lifestyle, and Alcohol Counseling   [x]  Study Drug:  Injection   Charles [name] is here for Week 53. He reports no abd pain, no changes in meds, and no visits to the Ed or Urgent care since seen last. VS taken at 1008. Blood drawn at 1020.Urine obtained at 1027.Dr Riley Kill did exam. EKG complete and read by Dr Riley Kill. Informed Charles [name] that the next visit will be a phone call. Voices understanding.   Current Outpatient Medications:    amLODipine (NORVASC) 5 MG tablet, Take 1 tablet (5 mg total) by mouth daily., Disp: 30 tablet, Rfl: 11   Blood Glucose Monitoring Suppl (FREESTYLE LITE) w/Device KIT, Use to check blood sugars 3 times daily, Disp: 1 kit, Rfl: 0   cholecalciferol (VITAMIN D3) 25 MCG (1000 UNIT) tablet, Take  2,000 Units by mouth daily., Disp: , Rfl:    Evolocumab (REPATHA SURECLICK) 140 MG/ML SOAJ, Inject 140 mg into the skin every 14 (fourteen) days., Disp: 6 mL, Rfl: 3   fenofibrate (TRICOR) 48 MG tablet, Take 1 tablet (48 mg total) by mouth daily., Disp: 90 tablet, Rfl: 3   glucose blood test strip, Use to check blood sugars 3 times a day, Disp: 300 each, Rfl: 5   icosapent Ethyl (VASCEPA) 1 g capsule, Take 2 capsules (2 g total) by mouth 2 (two) times daily., Disp: 120 capsule, Rfl: 11   insulin glargine-yfgn (SEMGLEE, YFGN,) 100 UNIT/ML Pen, Inject 22 Units into the skin daily., Disp: 30 mL, Rfl: 3   insulin lispro (HUMALOG KWIKPEN) 100 UNIT/ML KwikPen, Inject 2-12 Units into the skin 3 (three) times daily with meals, per sliding scale. BG=180-200: 2u; BG=201-250: 4u; BG=251-300: 6u; BG=3-1-350: 8u; BG=351-400: 10u; and BG=401-450: 12u., Disp: 30 mL, Rfl: 2   Insulin Pen Needle (TECHLITE PEN NEEDLES) 31G X 5 MM MISC, Use as directed to inject insulin up to 4 times a day., Disp: 100 each, Rfl: 11   Lancets (FREESTYLE) lancets, Use as directed to test blood sugars 3 times daily, Disp: 300  each, Rfl: 5   lisinopril (ZESTRIL) 5 MG tablet, Take 1 tablet by mouth daily, Disp: 90 tablet, Rfl: 3   Multiple Vitamins-Minerals (CENTURY PO), Take by mouth., Disp: , Rfl:    ketoconazole (NIZORAL) 2 % cream, Apply 1 application topically 2 (two) times daily for 2 weeks. (Patient not taking: Reported on 03/13/2023), Disp: 30 g, Rfl: 2  Current Facility-Administered Medications:    Study - ESSENCE - olezarsen 50 mg, 80 mg or placebo SQ injection (PI-Hilty), 80 mg, Subcutaneous, Q28 days, Hilty, Lisette Abu, MD, 80 mg at 03/13/23 1050

## 2023-04-15 ENCOUNTER — Other Ambulatory Visit (HOSPITAL_BASED_OUTPATIENT_CLINIC_OR_DEPARTMENT_OTHER): Payer: Self-pay

## 2023-04-16 ENCOUNTER — Other Ambulatory Visit (HOSPITAL_BASED_OUTPATIENT_CLINIC_OR_DEPARTMENT_OTHER): Payer: Self-pay

## 2023-04-17 ENCOUNTER — Other Ambulatory Visit: Payer: Self-pay

## 2023-04-19 IMAGING — US US RENAL
1 series · 14 of 25 positions shown · non-contrast
Comparison: CT abdomen pelvis 12/08/2021

CLINICAL DATA: Acute kidney injury

EXAM:
RENAL / URINARY TRACT ULTRASOUND COMPLETE

[Series 1: us renal · 0.25mm/px · 14 of 31 slices shown]
[im 1/31]
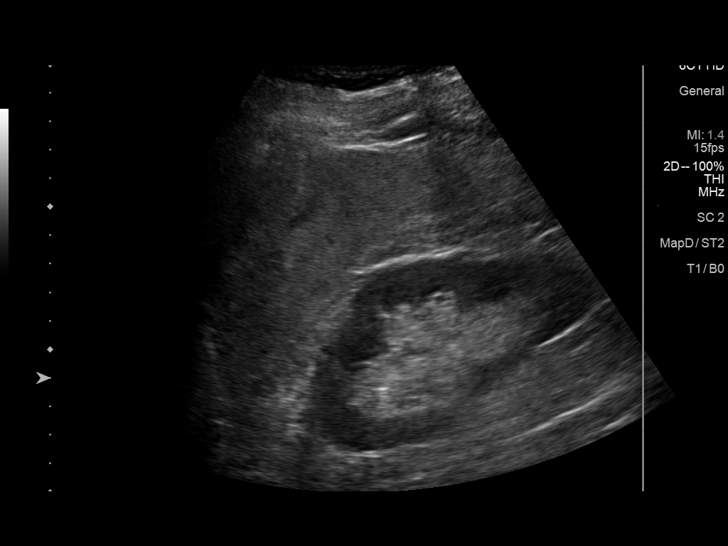
[im 3/31]
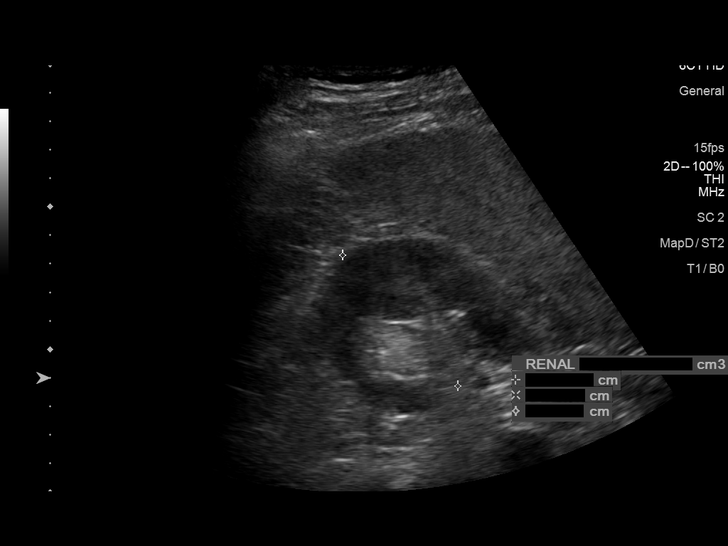
[im 6/31]
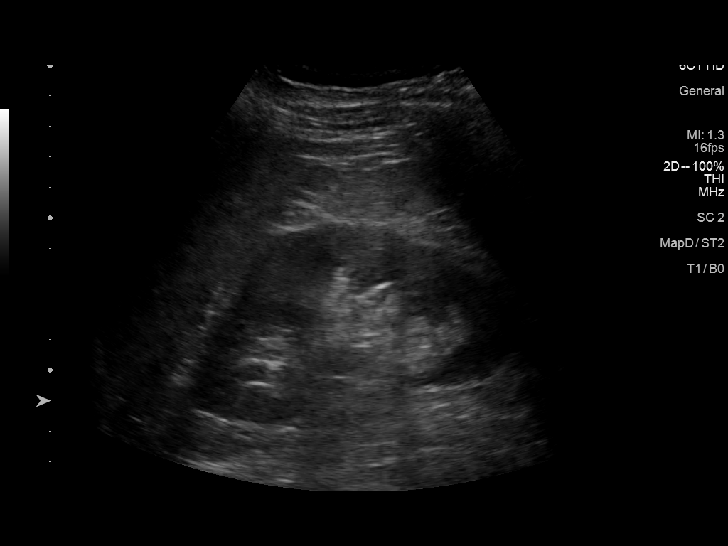
[im 8/31]
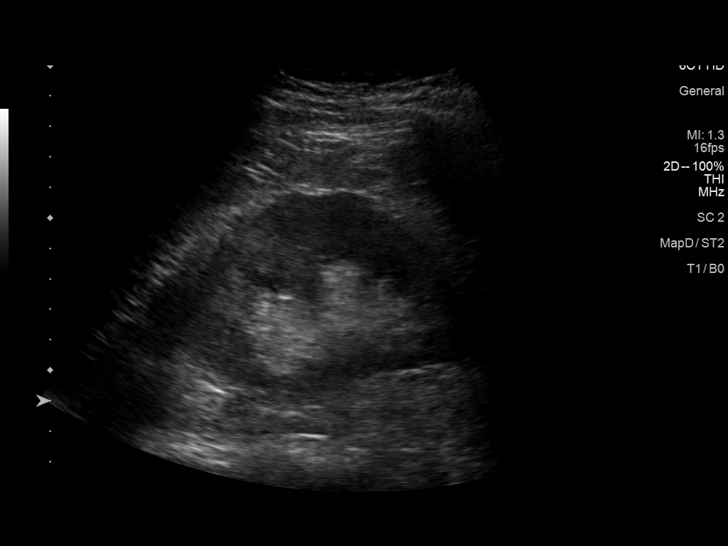
[im 11/31]
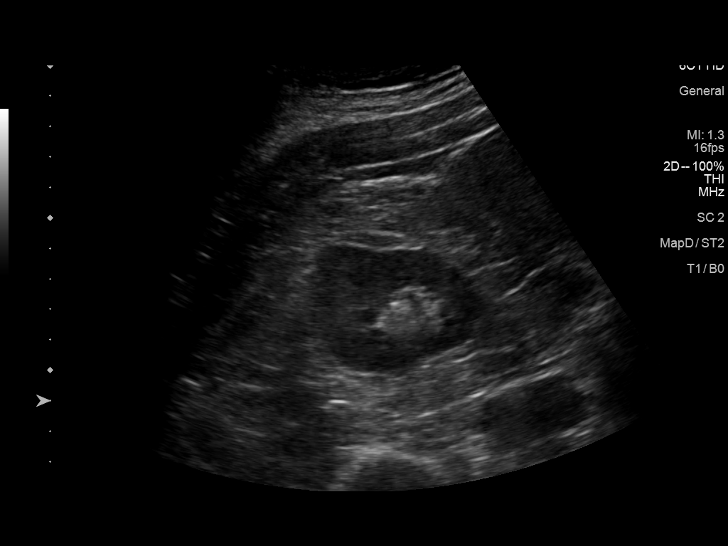
[im 12/31]
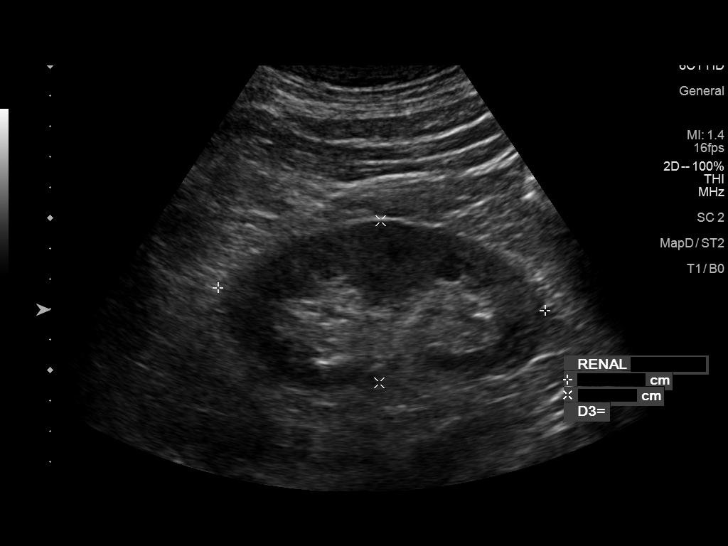
[im 14/31]
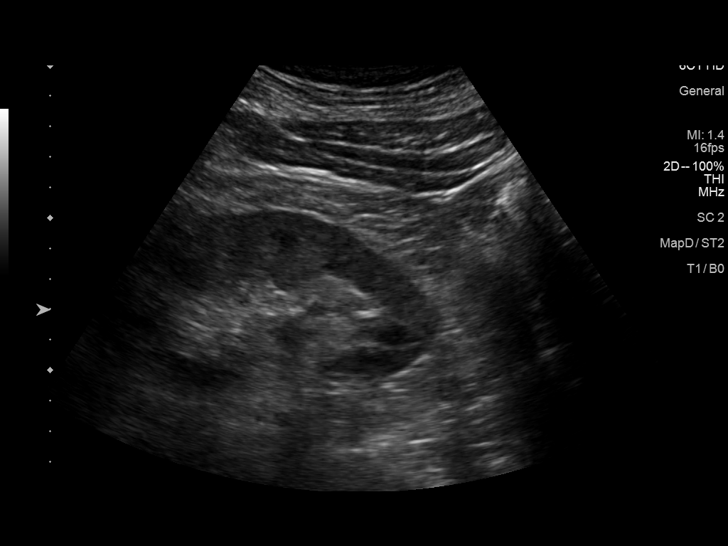
[im 17/31]
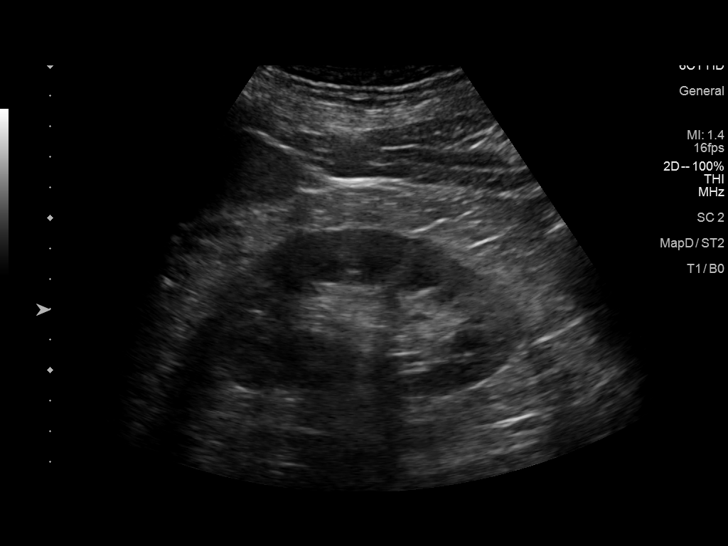
[im 19/31]
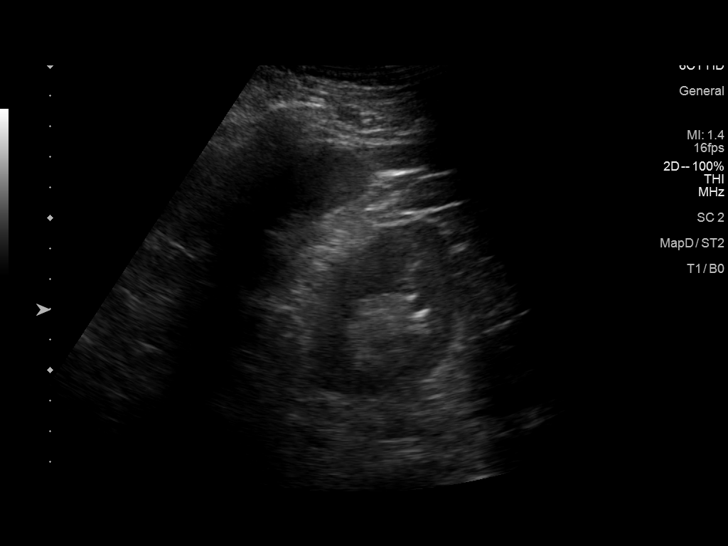
[im 21/31]
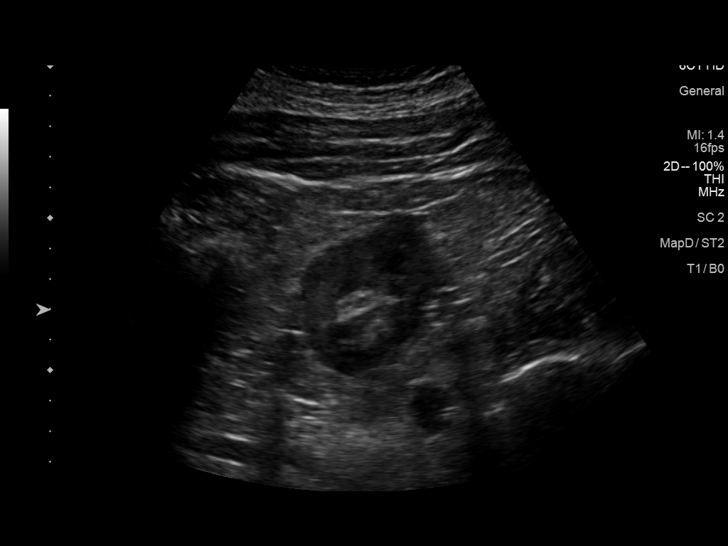
[im 23/31]
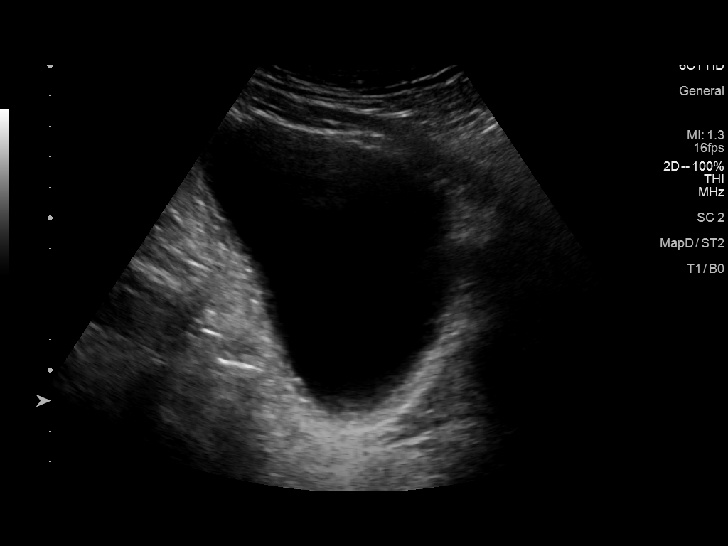
[im 26/31]
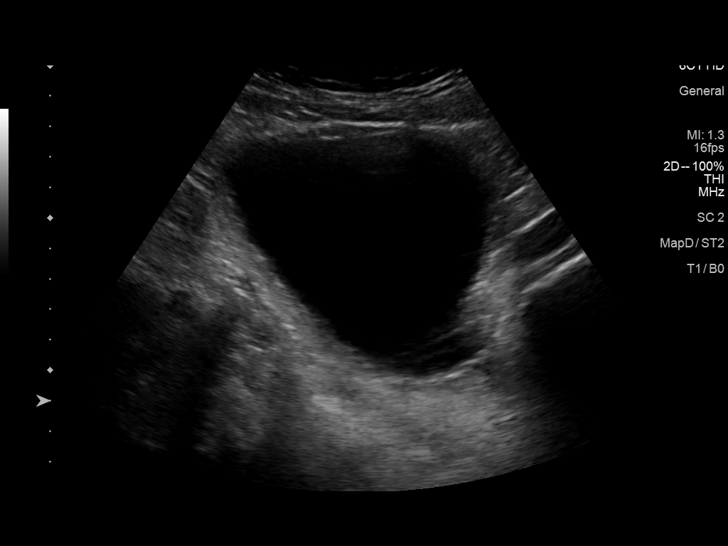
[im 28/31]
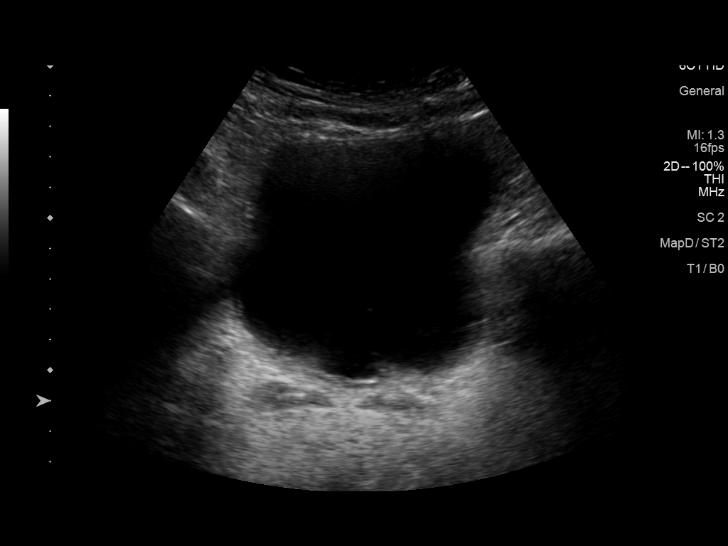
[im 31/31]
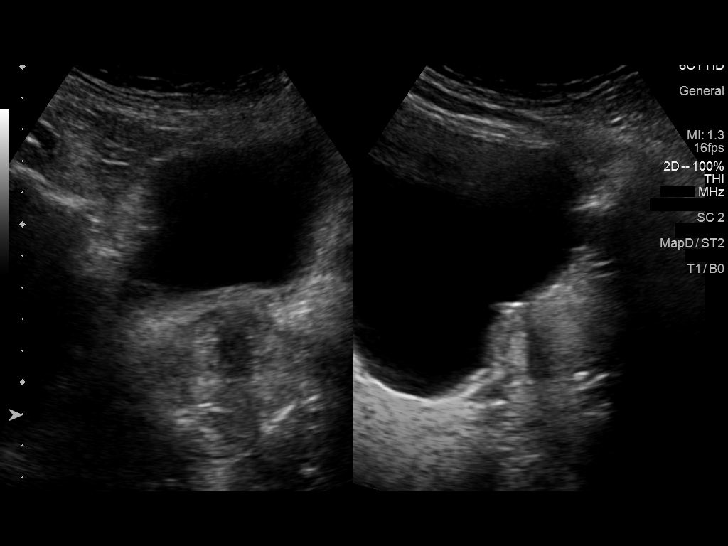

[14 of 25 positions shown; findings below may reference images not displayed]

FINDINGS: Right Kidney:

Renal measurements: 11.5 x 6.3 x 6.1 cm = volume: 231 mL.
Echogenicity within normal limits. No mass or hydronephrosis
visualized.

Left Kidney:

Renal measurements: 10.8 x 5.3 x 5.5 cm = volume: 164 mL.
Echogenicity within normal limits. No mass or hydronephrosis
visualized.

Bladder:

Appears normal for degree of bladder distention.

Other:

Prostate is enlarged with volume of 63 mL.

Diffuse increased echogenicity of the visualized portions of the
hepatic parenchyma are a nonspecific indicator of hepatocellular
dysfunction, most commonly steatosis.
IMPRESSION: Normal sonographic appearance of the kidneys.

## 2023-05-07 ENCOUNTER — Encounter: Payer: Self-pay | Admitting: *Deleted

## 2023-05-07 DIAGNOSIS — Z006 Encounter for examination for normal comparison and control in clinical research program: Secondary | ICD-10-CM

## 2023-05-07 NOTE — Research (Signed)
Message left for Charles [name] to call me back as part of Essence follow up phone call.

## 2023-05-07 NOTE — Research (Signed)
Spoke with Charles [name] as part of Essence research follow up. He reports feeling good, no abd. Pain, or any other pain, no S&S of pancreatitis, no changes in his meds, and no visits to the Ed or urgent care since last seen. Informed him I will be calling him again next month to follow up. Voices understanding.

## 2023-05-09 ENCOUNTER — Other Ambulatory Visit (HOSPITAL_BASED_OUTPATIENT_CLINIC_OR_DEPARTMENT_OTHER): Payer: Self-pay

## 2023-05-21 ENCOUNTER — Telehealth: Payer: Self-pay | Admitting: Emergency Medicine

## 2023-05-21 NOTE — Telephone Encounter (Signed)
Informed pt that we need to get fasting lipid panel soon. He has been on Repatha for about 3 months. Pt will come to [facility] to get fasting labs within the next week or 2

## 2023-05-24 ENCOUNTER — Other Ambulatory Visit: Payer: Self-pay

## 2023-05-24 DIAGNOSIS — E782 Mixed hyperlipidemia: Secondary | ICD-10-CM | POA: Diagnosis not present

## 2023-05-24 DIAGNOSIS — Z79899 Other long term (current) drug therapy: Secondary | ICD-10-CM | POA: Diagnosis not present

## 2023-05-25 ENCOUNTER — Encounter: Payer: Self-pay | Admitting: Cardiovascular Disease

## 2023-05-25 ENCOUNTER — Other Ambulatory Visit: Payer: Self-pay | Admitting: Internal Medicine

## 2023-05-25 LAB — LIPID PANEL
Chol/HDL Ratio: 2.1 ratio (ref 0.0–5.0)
Cholesterol, Total: 111 mg/dL (ref 100–199)
HDL: 53 mg/dL (ref >39)
LDL Chol Calc (NIH): 41 mg/dL (ref 0–99)
Triglycerides: 86 mg/dL (ref 0–149)
VLDL Cholesterol Cal: 17 mg/dL (ref 5–40)

## 2023-05-27 ENCOUNTER — Other Ambulatory Visit: Payer: Self-pay

## 2023-05-27 ENCOUNTER — Other Ambulatory Visit (HOSPITAL_BASED_OUTPATIENT_CLINIC_OR_DEPARTMENT_OTHER): Payer: Self-pay

## 2023-05-27 MED ORDER — FENOFIBRATE 48 MG PO TABS
48.0000 mg | ORAL_TABLET | Freq: Every day | ORAL | 3 refills | Status: DC
  Filled 2023-05-27: qty 90, 90d supply, fill #0
  Filled 2023-08-22: qty 90, 90d supply, fill #1
  Filled 2023-11-22: qty 90, 90d supply, fill #2
  Filled 2024-02-25: qty 90, 90d supply, fill #3

## 2023-06-03 ENCOUNTER — Encounter: Payer: Self-pay | Admitting: *Deleted

## 2023-06-03 DIAGNOSIS — Z006 Encounter for examination for normal comparison and control in clinical research program: Secondary | ICD-10-CM

## 2023-06-03 NOTE — Research (Signed)
Spoke with Mr Lyden as part of the follow up for Essence. He reports no abd pain, no changes in his meds, and no visits to the Ed or urgent care since I last spoke with him. Scheduled next appointment for Oct 23 at 1000. Voice understanding.

## 2023-06-07 ENCOUNTER — Other Ambulatory Visit (HOSPITAL_BASED_OUTPATIENT_CLINIC_OR_DEPARTMENT_OTHER): Payer: Self-pay

## 2023-06-17 ENCOUNTER — Other Ambulatory Visit (HOSPITAL_BASED_OUTPATIENT_CLINIC_OR_DEPARTMENT_OTHER): Payer: Self-pay

## 2023-06-27 ENCOUNTER — Other Ambulatory Visit (HOSPITAL_COMMUNITY): Payer: Self-pay

## 2023-07-01 ENCOUNTER — Other Ambulatory Visit (HOSPITAL_BASED_OUTPATIENT_CLINIC_OR_DEPARTMENT_OTHER): Payer: Self-pay

## 2023-07-08 ENCOUNTER — Other Ambulatory Visit (HOSPITAL_BASED_OUTPATIENT_CLINIC_OR_DEPARTMENT_OTHER): Payer: Self-pay

## 2023-07-10 ENCOUNTER — Encounter: Payer: BC Managed Care – PPO | Admitting: *Deleted

## 2023-07-10 ENCOUNTER — Other Ambulatory Visit: Payer: Self-pay | Admitting: *Deleted

## 2023-07-10 ENCOUNTER — Other Ambulatory Visit: Payer: Self-pay

## 2023-07-10 VITALS — BP 128/85 | HR 72 | Temp 98.2°F | Resp 16 | Wt 218.0 lb

## 2023-07-10 DIAGNOSIS — Z006 Encounter for examination for normal comparison and control in clinical research program: Secondary | ICD-10-CM

## 2023-07-10 NOTE — Research (Addendum)
Charles [name] Charles [name] 10-Jul-2023              Chemistry: Creatinine Kinase (CK) 403 U/L             [] Clinically Significant  [x] Not Clinically Significant    Urinalysis: Protein 10 mg/dL                                       [] Clinically Significant  [x] Not Clinically Significant Mucus 1+                                                   [] Clinically Significant  [x] Not Clinically Significant  Urine Chemistry: Urine Albumin 6.98 mg/dL                          [] Clinically Significant  [x] Not Clinically Significant Albumin Creatinine Ratio 116 mg/g            [] Clinically Significant  [x] Not Clinically Significant     CK from 09-Apr-2023 was 304 Ck from 17-Dec-2022 was 287 CK from 08-May-2022 was 8323 (He went to the ED for this one)   Any further action needed to be taken per the PI?  No  CK much improved compared to 2023  Chrystie Nose, MD, [facility]  [place]  [facility]  Medical Director of the Advanced Lipid Disorders &  Cardiovascular Risk Reduction Clinic Diplomate of the American Board of Clinical Lipidology Attending Cardiologist  Direct Dial: [phone]  Fax: [phone]  Website:  [email or web address]         Charles [name]     Subject Number:  [id number]            Randomization Number: [id number]            Date: 10-Jul-2023      [x] Vital Signs Collected - Blood Pressure:128/85 - Weight:218 lbs - Heart Rate:72 - Respiratory Rate:16 - Temperature:98.2 - Oxygen Saturation:99%  [x]  Physical Exam Completed by PI or SUB-I  [x]  12-lead ECG  [x]  Extended Urinalysis   [x]  Lab collection per protocol  [x]   (abdominal pain only) since last visit  [x]  Assessment of ER Visits, Hospitalizations, and Inpatient Days  [x]  Adverse Events and Concomitant Medications  [x]  Diet, Lifestyle, and Alcohol Counseling    Charles [name] is here for Post Week [name] of Charles. He reports no abd pain, no changes in his meds, and  no visits to the er or urgent care since last spoken to. VS taken at 1025. Dr Riley Kill did exam. Blood drawn at 1016. Urine obtained at 1004. This is his last visit for Charles research. He plans to follow up with Dr Rennis Golden.   Current Outpatient Medications:    amLODipine (NORVASC) 5 MG tablet, Take 1 tablet (5 mg total) by mouth daily., Disp: 30 tablet, Rfl: 11   Blood Glucose Monitoring Suppl (FREESTYLE LITE) w/Device KIT, Use to check blood sugars 3 times daily, Disp: 1 kit, Rfl: 0   cholecalciferol (VITAMIN D3) 25 MCG (1000 UNIT) tablet, Take 2,000 Units by mouth daily., Disp: , Rfl:    Evolocumab (REPATHA SURECLICK) 140 MG/ML SOAJ, Inject 140 mg into the skin every 14 (fourteen) days., Disp: 6 mL, Rfl: 3  fenofibrate (TRICOR) 48 MG tablet, Take 1 tablet (48 mg total) by mouth daily., Disp: 90 tablet, Rfl: 3   glucose blood test strip, Use to check blood sugars 3 times a day, Disp: 300 each, Rfl: 5   icosapent Ethyl (VASCEPA) 1 g capsule, Take 2 capsules (2 g total) by mouth 2 (two) times daily., Disp: 120 capsule, Rfl: 11   insulin glargine-yfgn (SEMGLEE, YFGN,) 100 UNIT/ML Pen, Inject 22 Units into the skin daily., Disp: 30 mL, Rfl: 3   insulin lispro (HUMALOG KWIKPEN) 100 UNIT/ML KwikPen, Inject 2-12 Units into the skin 3 (three) times daily with meals, per sliding scale. BG=180-200: 2u; BG=201-250: 4u; BG=251-300: 6u; BG=3-1-350: 8u; BG=351-400: 10u; and BG=401-450: 12u., Disp: 30 mL, Rfl: 2   Insulin Pen Needle (TECHLITE PEN NEEDLES) 31G X 5 MM MISC, Use as directed to inject insulin up to 4 times a day., Disp: 100 each, Rfl: 11   Lancets (FREESTYLE) lancets, Use as directed to test blood sugars 3 times daily, Disp: 300 each, Rfl: 5   lisinopril (ZESTRIL) 5 MG tablet, Take 1 tablet by mouth daily, Disp: 90 tablet, Rfl: 3   Multiple Vitamins-Minerals (CENTURY PO), Take by mouth., Disp: , Rfl:    ketoconazole (NIZORAL) 2 % cream, Apply 1 application topically 2 (two) times daily for 2 weeks.  (Patient not taking: Reported on 03/13/2023), Disp: 30 g, Rfl: 2  Current Facility-Administered Medications:    Study - Charles - olezarsen 50 mg, 80 mg or placebo SQ injection (PI-Hilty), 80 mg, Subcutaneous, Q28 days, Hilty, Lisette Abu, MD, 80 mg at 03/13/23 1050

## 2023-07-10 NOTE — Progress Notes (Signed)
Patient is in for follow up today.  He has completed his enrollment in the trial, and been off drug now for awhile.  He continues to do well, and we reviewed in detail the objectives of the trial, and the need for continued follow up with Dr. Salena Saner and Dr. Rennis Golden.  He just saw Dr. Salena Saner, and scheduled to see Dr. Rennis Golden late in the year.  No chest pain.  He feels good.     Alert, oriented male in NAD VS  BP 128/85  BP Location Left Arm  Patient Position Sitting  Cuff Size Normal  Pulse 72  Resp 16  Temp 98.2 F (36.8 C)  Temp src Temporal  SpO2 99 %  Weight 218 lb (98.9 kg)     No JVD No carotid bruits Lungs clear Cor regular without murmur Abd soft Ext  no edema.  Good pulses  Impression   Hyperlipidemia as noted in notes with completion of phase III ESSENCE trial Hypertension Diabetes mellitus  ECG - NSR. Delay in R wave voltage V2 and V3 likely due to lead placement.  Prior ECG read as Normal. Interval difference likely secondary to lead placement, without significant change.    Plan  Continue follow up with Dr. Wylene Simmer, [facility] and Dr. Dan Humphreys and now on Tricor  Crucita Lacorte D. Riley Kill, MD Medical Director, [facility]

## 2023-07-12 ENCOUNTER — Other Ambulatory Visit (HOSPITAL_BASED_OUTPATIENT_CLINIC_OR_DEPARTMENT_OTHER): Payer: Self-pay

## 2023-07-15 ENCOUNTER — Other Ambulatory Visit (HOSPITAL_BASED_OUTPATIENT_CLINIC_OR_DEPARTMENT_OTHER): Payer: Self-pay

## 2023-07-15 ENCOUNTER — Other Ambulatory Visit: Payer: Self-pay

## 2023-07-16 ENCOUNTER — Other Ambulatory Visit (HOSPITAL_BASED_OUTPATIENT_CLINIC_OR_DEPARTMENT_OTHER): Payer: Self-pay

## 2023-07-29 ENCOUNTER — Other Ambulatory Visit (HOSPITAL_BASED_OUTPATIENT_CLINIC_OR_DEPARTMENT_OTHER): Payer: Self-pay

## 2023-07-29 ENCOUNTER — Other Ambulatory Visit: Payer: Self-pay

## 2023-07-30 ENCOUNTER — Telehealth: Payer: Self-pay | Admitting: Pharmacy Technician

## 2023-07-30 ENCOUNTER — Other Ambulatory Visit (HOSPITAL_BASED_OUTPATIENT_CLINIC_OR_DEPARTMENT_OTHER): Payer: Self-pay

## 2023-07-30 ENCOUNTER — Telehealth: Payer: Self-pay | Admitting: Cardiovascular Disease

## 2023-07-30 ENCOUNTER — Other Ambulatory Visit (HOSPITAL_COMMUNITY): Payer: Self-pay

## 2023-07-30 NOTE — Telephone Encounter (Signed)
Patient identification verified by 2 forms. Marilynn Rail, RN    Called and spoke to patient  Informed patient PA submitted to [facility], awaiting updates at this time  Patient has no further questions or concerns

## 2023-07-30 NOTE — Telephone Encounter (Signed)
FWD to prior auth team for assistance

## 2023-07-30 NOTE — Telephone Encounter (Signed)
Pharmacy Patient Advocate Encounter   Received notification from Pt Calls Messages that prior authorization for Vascepa is required/requested.   Insurance verification completed.   The patient is insured through [facility] .   Per test claim: PA required; PA submitted to above mentioned insurance via CoverMyMeds Key/confirmation #/EOC [id number] Status is pending

## 2023-07-30 NOTE — Telephone Encounter (Signed)
Pt c/o medication issue:  1. Name of Medication: icosapent Ethyl (VASCEPA) 1 g capsule   2. How are you currently taking this medication (dosage and times per day)?    3. Are you having a reaction (difficulty breathing--STAT)? no  4. What is your medication issue? Prior Berkley Harvey is needed. Please advise

## 2023-07-31 ENCOUNTER — Other Ambulatory Visit (HOSPITAL_BASED_OUTPATIENT_CLINIC_OR_DEPARTMENT_OTHER): Payer: Self-pay

## 2023-08-01 ENCOUNTER — Other Ambulatory Visit (HOSPITAL_BASED_OUTPATIENT_CLINIC_OR_DEPARTMENT_OTHER): Payer: Self-pay

## 2023-08-01 MED ORDER — INSULIN LISPRO (1 UNIT DIAL) 100 UNIT/ML (KWIKPEN)
2.0000 [IU] | PEN_INJECTOR | Freq: Three times a day (TID) | SUBCUTANEOUS | 2 refills | Status: DC
  Filled 2023-08-01: qty 30, 84d supply, fill #0
  Filled 2023-11-01: qty 30, 84d supply, fill #1
  Filled 2024-01-20: qty 30, 84d supply, fill #2

## 2023-08-02 ENCOUNTER — Other Ambulatory Visit (HOSPITAL_BASED_OUTPATIENT_CLINIC_OR_DEPARTMENT_OTHER): Payer: Self-pay

## 2023-08-05 ENCOUNTER — Other Ambulatory Visit (HOSPITAL_BASED_OUTPATIENT_CLINIC_OR_DEPARTMENT_OTHER): Payer: Self-pay

## 2023-08-06 ENCOUNTER — Other Ambulatory Visit (HOSPITAL_BASED_OUTPATIENT_CLINIC_OR_DEPARTMENT_OTHER): Payer: Self-pay

## 2023-08-06 ENCOUNTER — Other Ambulatory Visit (HOSPITAL_COMMUNITY): Payer: Self-pay

## 2023-08-07 ENCOUNTER — Other Ambulatory Visit (HOSPITAL_BASED_OUTPATIENT_CLINIC_OR_DEPARTMENT_OTHER): Payer: Self-pay

## 2023-08-07 DIAGNOSIS — N182 Chronic kidney disease, stage 2 (mild): Secondary | ICD-10-CM | POA: Diagnosis not present

## 2023-08-07 DIAGNOSIS — E1122 Type 2 diabetes mellitus with diabetic chronic kidney disease: Secondary | ICD-10-CM | POA: Diagnosis not present

## 2023-08-07 NOTE — Telephone Encounter (Signed)
Pharmacy Patient Advocate Encounter  Received notification from [facility] that Prior Authorization for Vascepa has been APPROVED from 08/07/23 to 08/06/24   PA #/Case ID/Reference #: [id number]

## 2023-08-08 ENCOUNTER — Other Ambulatory Visit (HOSPITAL_BASED_OUTPATIENT_CLINIC_OR_DEPARTMENT_OTHER): Payer: Self-pay

## 2023-08-13 ENCOUNTER — Other Ambulatory Visit: Payer: Self-pay

## 2023-08-13 ENCOUNTER — Other Ambulatory Visit (HOSPITAL_BASED_OUTPATIENT_CLINIC_OR_DEPARTMENT_OTHER): Payer: Self-pay

## 2023-08-22 ENCOUNTER — Encounter (INDEPENDENT_AMBULATORY_CARE_PROVIDER_SITE_OTHER): Payer: Self-pay

## 2023-08-28 DIAGNOSIS — R809 Proteinuria, unspecified: Secondary | ICD-10-CM | POA: Diagnosis not present

## 2023-08-28 DIAGNOSIS — N179 Acute kidney failure, unspecified: Secondary | ICD-10-CM | POA: Diagnosis not present

## 2023-08-28 DIAGNOSIS — I129 Hypertensive chronic kidney disease with stage 1 through stage 4 chronic kidney disease, or unspecified chronic kidney disease: Secondary | ICD-10-CM | POA: Diagnosis not present

## 2023-08-28 DIAGNOSIS — E1122 Type 2 diabetes mellitus with diabetic chronic kidney disease: Secondary | ICD-10-CM | POA: Diagnosis not present

## 2023-09-03 ENCOUNTER — Other Ambulatory Visit (HOSPITAL_BASED_OUTPATIENT_CLINIC_OR_DEPARTMENT_OTHER): Payer: Self-pay

## 2023-09-13 ENCOUNTER — Other Ambulatory Visit (HOSPITAL_BASED_OUTPATIENT_CLINIC_OR_DEPARTMENT_OTHER): Payer: Self-pay

## 2023-09-13 ENCOUNTER — Other Ambulatory Visit: Payer: Self-pay

## 2023-09-13 MED ORDER — LISINOPRIL 10 MG PO TABS
10.0000 mg | ORAL_TABLET | Freq: Every day | ORAL | 3 refills | Status: AC
  Filled 2023-09-13: qty 90, 90d supply, fill #0
  Filled 2023-12-11: qty 90, 90d supply, fill #1
  Filled 2024-03-13: qty 90, 90d supply, fill #2
  Filled 2024-06-16: qty 90, 90d supply, fill #3

## 2023-09-13 MED ORDER — LISINOPRIL 5 MG PO TABS
5.0000 mg | ORAL_TABLET | Freq: Every day | ORAL | 3 refills | Status: DC
  Filled 2023-09-13: qty 90, 90d supply, fill #0

## 2023-09-20 ENCOUNTER — Other Ambulatory Visit (HOSPITAL_BASED_OUTPATIENT_CLINIC_OR_DEPARTMENT_OTHER): Payer: Self-pay

## 2023-10-02 ENCOUNTER — Other Ambulatory Visit (HOSPITAL_BASED_OUTPATIENT_CLINIC_OR_DEPARTMENT_OTHER): Payer: Self-pay

## 2023-10-02 ENCOUNTER — Other Ambulatory Visit: Payer: Self-pay

## 2023-10-04 ENCOUNTER — Other Ambulatory Visit (HOSPITAL_BASED_OUTPATIENT_CLINIC_OR_DEPARTMENT_OTHER): Payer: Self-pay

## 2023-11-11 ENCOUNTER — Other Ambulatory Visit: Payer: Self-pay

## 2023-11-11 ENCOUNTER — Other Ambulatory Visit (HOSPITAL_BASED_OUTPATIENT_CLINIC_OR_DEPARTMENT_OTHER): Payer: Self-pay

## 2023-12-16 ENCOUNTER — Other Ambulatory Visit (HOSPITAL_BASED_OUTPATIENT_CLINIC_OR_DEPARTMENT_OTHER): Payer: Self-pay

## 2023-12-23 ENCOUNTER — Ambulatory Visit: Payer: BC Managed Care – PPO | Attending: Cardiovascular Disease | Admitting: Cardiovascular Disease

## 2023-12-23 VITALS — BP 100/72 | HR 80 | Ht 72.0 in | Wt 223.0 lb

## 2023-12-23 DIAGNOSIS — Z79899 Other long term (current) drug therapy: Secondary | ICD-10-CM | POA: Diagnosis not present

## 2023-12-23 DIAGNOSIS — I10 Essential (primary) hypertension: Secondary | ICD-10-CM

## 2023-12-23 DIAGNOSIS — E1110 Type 2 diabetes mellitus with ketoacidosis without coma: Secondary | ICD-10-CM

## 2023-12-23 DIAGNOSIS — E782 Mixed hyperlipidemia: Secondary | ICD-10-CM | POA: Diagnosis not present

## 2023-12-23 NOTE — Progress Notes (Unsigned)
 Cardiology Office Note:    Date:  12/24/2023   ID:  Ree [name], DOB [birth year], MRN [id number]  PCP:  Gaspar Garbe, MD   [facility] Providers Cardiologist:  Thurmon Fair, MD     Referring MD: Gaspar Garbe, MD   No chief complaint on file.   History of Present Illness:    Charles [name] is a 53 y.o. male with a hx of longstanding mixed hyperlipidemia and hypertension, previously hospitalized with acute pancreatitis in the setting of severe hypertriglyceridemia, episode of diabetic ketoacidosis in March 2023 when he was newly diagnosed with diabetes mellitus  (although hemoglobin A1c elevation suggest this is a more chronic problem).  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  He also has a family history of early onset coronary artery disease (father with myocardial infarction and bypass surgery at age 41).  In 2023 he had marked increase in his CK, presumably statin myopathy.  He was enrolled in the ESSENCE (olezarsen vs placebo) lipid-lowering trial in our clinic, now completed and is seeing Dr. Rennis Golden in the lipid clinic.  He is also seeing Dr. Ronalee Belts at [facility], for recent problems with acute kidney injury during the episode of pancreatitis.  The patient specifically denies any chest pain at rest or with exertion, dyspnea at rest or with exertion, orthopnea, paroxysmal nocturnal dyspnea, syncope, palpitations, focal neurological deficits, intermittent claudication, lower extremity edema, unexplained weight gain, cough, hemoptysis or wheezing.   He continues to have very good metabolic control, but is steadily gaining weight ever since he started treatment with insulin, roughly 20 pounds in the last year.  He is now borderline obese with a BMI of 30.  Hemoglobin A1c is excellent at 6.1%.  Last September his triglycerides were 86, recheck today they are a little high at 222.   His LDL cholesterol is great at 43 and his HDL is worse at 43 going down in parallel with his weight gain.  He has normal kidney function with creatinine 1.11.   He has an appointment scheduled with Dr. Rennis Golden for lipid management in May.   He is taking Repatha and low-dose fenofibrate and Vascepa.     In August 2023 his CK abruptly increased to 3500 and his rosuvastatin was discontinued and the dose of fenofibrate was increased to 48 mg daily, with rapid resolution of the abnormalities.  He was never symptomatic.  The CK normalized within a month.  He has normal kidney function.  Had a normal ECG stress test on 01/17/2022 exercising for over 10 minutes/over 12 METS.  He was hospitalized with acute abdominal pain on 12/08/2021 at [facility].  He was diagnosed with acute pancreatitis.  Lipase was 454, CT showed typical findings of acute pancreatitis, also showed diffuse hepatic steatosis.  There was no evidence of gallstones or pancreatic abscess or pseudocyst.   He had severe metabolic abnormalities.  Peak glucose was 926 and sodium was 164, creatinine 3.65.  He was acidotic with pH 7.045 anion gap of 33 and serum bicarb of 5, beta hydroxybutyrate was greater than 7.5, consistent with diabetic ketoacidosis and severe volume contraction.  On arrival, hemoglobin A1c was 14.6% suggesting at least a few months of diabetes mellitus.  Direct LDL was only 62, but HDL was only 12, total cholesterol was 680 and triglycerides were 2496 (repeat assay 2611).  By the time of discharge his  triglycerides had decreased to [phone] range.  Plasmapheresis was considered but deemed unnecessary.  Labs from his primary care physician and recent hospitalization from:  2019  triglycerides 375, total cholesterol 189, HDL 37, LDL 77 and fasting glucose 109 2020  triglycerides 284, total cholesterol 205, HDL 36, LDL 112, fasting glucose 113 11/03/2020 triglycerides 2364, total cholesterol 300, HDL  11, fasting glucose 233.  Same lab assay his sodium was low at 121.  He was advised to go to the emergency department but he was asymptomatic and declined. 12/08/2021 triglycerides 2496, total cholesterol 680, HDL 12, direct LDL 62 02/21/2022 triglycerides 170, total cholesterol 197, HDL 42, LDL 125  Charles [name] has been aware of hypertriglyceridemia for about 30 years, since he was a young adult.  He has been prescribed statins repeatedly, but these have consistently led to abnormalities in his liver function tests and have been stopped, followed by normalization of the liver test abnormalities.   He does not have a history of CAD or PAD.  I do not think he is ever had a formal evaluation for such either.  During his hospitalization with acute pancreatitis an abdominal CT was performed that did not mention any evidence of atherosclerosis in the aorta or the visceral branches.  Additional medical problems include essential hypertension which has generally been well treated and clonic hemifacial spasm (left blepharospasm) which has been frustratingly difficult to treat.  Cardiac medications have been stopped for periods of time to see if this will lead to improvement in blepharospasm, without significant change.  Jasiri's father had his first myocardial infarction at age 62 and had bypass surgery shortly thereafter.  He did smoke.  He died at age 77.  Dandra's mother has diabetes mellitus and had a stroke in her late 46s.  Past Medical History:  Diagnosis Date   Diabetes (HCC)    Hyperlipidemia    Hypertension     Past Surgical History:  Procedure Laterality Date   MASS EXCISION  06/17/2012   Procedure: MINOR EXCISION OF MASS;  Surgeon: Currie Paris, MD;  Location: [facility];  Service: General;  Laterality: N/A;  Removal Cyst of Right Upper Back    Current Medications: Current Meds  Medication Sig   amLODipine (NORVASC) 5 MG tablet Take 1 tablet (5 mg total) by mouth daily.    Blood Glucose Monitoring Suppl (FREESTYLE LITE) w/Device KIT Use to check blood sugars 3 times daily   cholecalciferol (VITAMIN D3) 25 MCG (1000 UNIT) tablet Take 2,000 Units by mouth daily.   Evolocumab (REPATHA SURECLICK) 140 MG/ML SOAJ Inject 140 mg into the skin every 14 (fourteen) days.   fenofibrate (TRICOR) 48 MG tablet Take 1 tablet (48 mg total) by mouth daily.   glucose blood test strip Use to check blood sugars 3 times a day   icosapent Ethyl (VASCEPA) 1 g capsule Take 2 capsules (2 g total) by mouth 2 (two) times daily.   insulin glargine-yfgn (SEMGLEE, YFGN,) 100 UNIT/ML Pen Inject 22 Units into the skin daily.   insulin lispro (HUMALOG) 100 UNIT/ML KwikPen Inject 2-12 Units into the skin 3 (three) times daily with meals per sliding scale. BG=180-200: 2u; BG=201-250: 4u; BG=251-300: 6u; BG=3-1-350: 8u; BG=351-400: 10u; and BG=401-450: 12u.   Insulin Pen Needle (TECHLITE PEN NEEDLES) 31G X 5 MM MISC Use as directed to inject insulin up to 4 times a day.   ketoconazole (NIZORAL) 2 % cream Apply 1 application topically 2 (two) times daily for 2 weeks.  Lancets (FREESTYLE) lancets Use as directed to test blood sugars 3 times daily   lisinopril (ZESTRIL) 10 MG tablet Take 1 tablet (10 mg total) by mouth daily.   Multiple Vitamins-Minerals (CENTURY PO) Take by mouth.     Allergies:   Statins   Family History: The patient's family history includes Cancer in his mother; Heart disease in his father; Lymphoma in his mother.  ROS:   Please see the history of present illness.     All other systems reviewed and are negative.  EKGs/Labs/Other Studies Reviewed:    The following studies were reviewed today:   EKG:   EKG Interpretation Date/Time:  Monday December 23 2023 11:13:17 EDT Ventricular Rate:  80 PR Interval:  154 QRS Duration:  100 QT Interval:  370 QTC Calculation: 426 R Axis:   -12  Text Interpretation: Normal sinus rhythm Minimal voltage criteria for LVH, may be  normal variant ( R in aVL ) No previous ECGs available Confirmed by Destynee Stringfellow [phone]) on 12/23/2023 11:21:38 AM         Recent Labs: 12/23/2023: BUN 23; Creatinine, Ser 1.11; Potassium 4.7; Sodium 139  Discharge labs from [facility] from 12/17/2021 Sodium 139, potassium 3.9, CO2 26, BUN 36, creatinine 2.30, glucose 211 WBC 11.9, hemoglobin 11.4, platelets 236 K  12/26/2021 Creatinine 1.17, hemoglobin 11.3  01/22/2022 hemoglobin A1c 7.2%  04/04/2022 hemoglobin A1c 5.5%  Recent Lipid Panel    Component Value Date/Time   CHOL 121 12/23/2023 1151   TRIG 222 (H) 12/23/2023 1151   HDL 43 12/23/2023 1151   CHOLHDL 2.8 12/23/2023 1151   LDLCALC 43 12/23/2023 1151   2019  triglycerides 375, total cholesterol 189, HDL 37, LDL 77 and fasting glucose 109 2020  triglycerides 284, total cholesterol 205, HDL 36, LDL 112, fasting glucose 113 11/03/2020 triglycerides 2364, total cholesterol 300, HDL 11, fasting glucose 233.  Same lab assay his sodium was low at 121.  I am not sure if these labs were fasting, but suspect so. He was advised to go to the emergency department but he was asymptomatic and declined. 12/08/2021 triglycerides 2496, total cholesterol 680, HDL 12, direct LDL 62 02/21/2022 triglycerides 170, cholesterol 197, HDL 42, LDL 125  Risk Assessment/Calculations:           Physical Exam:    VS:  BP 100/72 (BP Location: Left Arm, Patient Position: Sitting)   Pulse 80   Ht 6' (1.829 m)   Wt 223 lb (101.2 kg)   SpO2 97%   BMI 30.24 kg/m     Wt Readings from Last 3 Encounters:  12/23/23 223 lb (101.2 kg)  07/10/23 218 lb (98.9 kg)  03/13/23 217 lb (98.4 kg)      General: Alert, oriented x3, no distress, borderline obese Head: no evidence of trauma, PERRL, EOMI, no exophtalmos or lid lag, no myxedema, no xanthelasma; normal ears, nose and oropharynx Neck: normal jugular venous pulsations and no hepatojugular reflux; brisk carotid pulses without delay and no carotid  bruits Chest: clear to auscultation, no signs of consolidation by percussion or palpation, normal fremitus, symmetrical and full respiratory excursions Cardiovascular: normal position and quality of the apical impulse, regular rhythm, normal first and second heart sounds, no murmurs, rubs or gallops Abdomen: no tenderness or distention, no masses by palpation, no abnormal pulsatility or arterial bruits, normal bowel sounds, no hepatosplenomegaly Extremities: no clubbing, cyanosis or edema; 2+ radial, ulnar and brachial pulses bilaterally; 2+ right femoral, posterior tibial and dorsalis pedis pulses; 2+ left  femoral, posterior tibial and dorsalis pedis pulses; no subclavian or femoral bruits Neurological: grossly nonfocal Psych: Normal mood and affect    ASSESSMENT:    1. Mixed hyperlipidemia   2. Essential hypertension   3. Medication management   4. Diabetes mellitus type 2 with ketoacidosis, uncontrolled (HCC)     PLAN:    In order of problems listed above:  Mixed hyperlipidemia: Rob has longstanding evidence of an insulin resistance/highly atherogenic lipid profile, but has had a remarkable improvement in all his metabolic parameters.  His triglycerides were normal last year, but they have increased slightly, likely an expression of worsening insulin resistance with the weight gain.  He had previous problems with abnormal LFTs on statin and last August developed evidence of statin myopathy/CK elevation while on combination rosuvastatin and fibrate.  We have stopped treatment with statins.  LDL cholesterol is excellent on Repatha, he is taking combination of fenofibrate and Vascepa for hypertriglyceridemia.  Glycemic control seems to be excellent..   DM: Compared to last November when it was 5.7%, Hemoglobin A1c is slightly higher than at 6.1%, but still excellent control.  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  Has occasional episodes of mild hypoglycemia and is gaining weight steadily.  May need to decrease his insulin dosage.  He expressed the wish to see an endocrinologist.  I think this is a great idea.  We talked about the risk of serious complications including precipitate in DKA if we try to add back SGLT2 inhibitors or the risk of acute pancreatitis if we start GLP-1 agonist on top of the insulin.  A great way to bring down his triglyceride and hemoglobin A1c levels without taking this risk and the increased physical activity. AKI: Renal function has returned to normal.  He is taking an ACE inhibitor.  Blood pressure does not allow adding other renal protection agents. HTN: Excellent control on very low-dose lisinopril plus amlodipine Increased risk of CAD: Asymptomatic, normal recent stress test with excellent exercise capacity.  I think a calcium score will be very difficult to interpret in view of his frequent attempts at treatment of his hyperlipidemia in the past.  His kidney function has improved substantially and if necessary, we can perform coronary CT angiography or invasive angiography in the future, but this would only be necessary if symptoms develop.    Medication Adjustments/Labs and Tests Ordered: Current medicines are reviewed at length with the patient today.  Concerns regarding medicines are outlined above.  Orders Placed This Encounter  Procedures   Basic metabolic panel with GFR   Lipid panel   Hemoglobin A1c   EKG 12-Lead   No orders of the defined types were placed in this encounter.   Patient Instructions  Medication Instructions:  No changes *If you need a refill on your cardiac medications before your next appointment, please call your pharmacy*  Lab Work: Lipid, BMP, A1C- today If you have labs (blood work) drawn today and your tests are completely normal, you will  receive your results only by: MyChart Message (if you have MyChart) OR A paper copy in the mail If you have any lab test that is abnormal or we need to change your treatment, we will call you to review the results.  Follow-Up: At [facility], you and your health needs are  our priority.  As part of our continuing mission to provide you with exceptional heart care, our providers are all part of one team.  This team includes your primary Cardiologist (physician) and Advanced Practice Providers or APPs (Physician Assistants and Nurse Practitioners) who all work together to provide you with the care you need, when you need it.  Your next appointment:   1 year(s)  Provider:   Thurmon Fair, MD     We recommend signing up for the patient portal called "MyChart".  Sign up information is provided on this After Visit Summary.  MyChart is used to connect with patients for Virtual Visits (Telemedicine).  Patients are able to view lab/test results, encounter notes, upcoming appointments, etc.  Non-urgent messages can be sent to your provider as well.   To learn more about what you can do with MyChart, go to [email or web address].         1st Floor: - Lobby - Registration  - Pharmacy  - Lab - Cafe  2nd Floor: - PV Lab - Diagnostic Testing (echo, CT, nuclear med)  3rd Floor: - Vacant  4th Floor: - TCTS (cardiothoracic surgery) - AFib Clinic - Structural Heart Clinic - Vascular Surgery  - Vascular Ultrasound  5th Floor: - HeartCare Cardiology (general and EP) - Clinical Pharmacy for coumadin, hypertension, lipid, weight-loss medications, and med management appointments    Valet parking services will be available as well.      Signed, Thurmon Fair, MD  12/24/2023 3:23 PM    [facility]

## 2023-12-23 NOTE — Patient Instructions (Signed)
 Medication Instructions:  No changes *If you need a refill on your cardiac medications before your next appointment, please call your pharmacy*  Lab Work: Lipid, BMP, A1C- today If you have labs (blood work) drawn today and your tests are completely normal, you will receive your results only by: MyChart Message (if you have MyChart) OR A paper copy in the mail If you have any lab test that is abnormal or we need to change your treatment, we will call you to review the results.  Follow-Up: At [facility], you and your health needs are our priority.  As part of our continuing mission to provide you with exceptional heart care, our providers are all part of one team.  This team includes your primary Cardiologist (physician) and Advanced Practice Providers or APPs (Physician Assistants and Nurse Practitioners) who all work together to provide you with the care you need, when you need it.  Your next appointment:   1 year(s)  Provider:   Thurmon Fair, MD     We recommend signing up for the patient portal called "MyChart".  Sign up information is provided on this After Visit Summary.  MyChart is used to connect with patients for Virtual Visits (Telemedicine).  Patients are able to view lab/test results, encounter notes, upcoming appointments, etc.  Non-urgent messages can be sent to your provider as well.   To learn more about what you can do with MyChart, go to [email or web address].         1st Floor: - Lobby - Registration  - Pharmacy  - Lab - Cafe  2nd Floor: - PV Lab - Diagnostic Testing (echo, CT, nuclear med)  3rd Floor: - Vacant  4th Floor: - TCTS (cardiothoracic surgery) - AFib Clinic - Structural Heart Clinic - Vascular Surgery  - Vascular Ultrasound  5th Floor: - HeartCare Cardiology (general and EP) - Clinical Pharmacy for coumadin, hypertension, lipid, weight-loss medications, and med management appointments    Valet parking services will be  available as well.

## 2023-12-24 ENCOUNTER — Encounter: Payer: Self-pay | Admitting: Cardiovascular Disease

## 2023-12-24 LAB — BASIC METABOLIC PANEL WITH GFR
BUN/Creatinine Ratio: 21 — ABNORMAL HIGH (ref 9–20)
CO2: 22 mmol/L (ref 20–29)
Calcium: 9.9 mg/dL (ref 8.7–10.2)
Chloride: 102 mmol/L (ref 96–106)
Creatinine, Ser: 21 mg/dL — ABNORMAL HIGH (ref 9–20)
Glucose: 108 mg/dL — ABNORMAL HIGH (ref 70–99)
Potassium: 4.7 mmol/L (ref 3.5–5.2)
Sodium: 139 mmol/L (ref 134–144)
eGFR: 79 mL/min/{1.73_m2} (ref >59)
eGFR: 79 mg/dL (ref 6–24)

## 2023-12-24 LAB — HEMOGLOBIN A1C
Est. average glucose Bld gHb Est-mCnc: 134 mg/dL
Hgb A1c MFr Bld: 6.3 % — ABNORMAL HIGH (ref 4.8–5.6)

## 2023-12-24 LAB — LIPID PANEL
Chol/HDL Ratio: 2.8 ratio (ref 0.0–5.0)
Cholesterol, Total: 121 mg/dL (ref 100–199)
HDL: 43 mg/dL (ref >39)
LDL Chol Calc (NIH): 43 mg/dL (ref 0–99)
Triglycerides: 222 mg/dL — ABNORMAL HIGH (ref 0–149)
VLDL Cholesterol Cal: 35 mg/dL (ref 5–40)

## 2023-12-28 ENCOUNTER — Other Ambulatory Visit (HOSPITAL_BASED_OUTPATIENT_CLINIC_OR_DEPARTMENT_OTHER): Payer: Self-pay | Admitting: Cardiovascular Disease

## 2023-12-29 ENCOUNTER — Other Ambulatory Visit: Payer: Self-pay

## 2023-12-30 ENCOUNTER — Other Ambulatory Visit (HOSPITAL_BASED_OUTPATIENT_CLINIC_OR_DEPARTMENT_OTHER): Payer: Self-pay

## 2023-12-31 ENCOUNTER — Other Ambulatory Visit (HOSPITAL_BASED_OUTPATIENT_CLINIC_OR_DEPARTMENT_OTHER): Payer: Self-pay

## 2023-12-31 MED ORDER — AMLODIPINE BESYLATE 5 MG PO TABS
5.0000 mg | ORAL_TABLET | Freq: Every day | ORAL | 11 refills | Status: AC
  Filled 2023-12-31: qty 30, 30d supply, fill #0
  Filled 2024-01-28: qty 30, 30d supply, fill #1
  Filled 2024-03-13: qty 30, 30d supply, fill #2
  Filled 2024-04-14: qty 30, 30d supply, fill #3
  Filled 2024-05-14: qty 30, 30d supply, fill #4
  Filled 2024-06-16: qty 30, 30d supply, fill #5
  Filled 2024-07-21: qty 30, 30d supply, fill #6
  Filled 2024-08-18: qty 30, 30d supply, fill #7
  Filled 2024-09-22 (×2): qty 30, 30d supply, fill #8
  Filled 2024-10-17: qty 30, 30d supply, fill #9

## 2024-01-10 ENCOUNTER — Other Ambulatory Visit: Payer: Self-pay | Admitting: Cardiovascular Disease

## 2024-01-10 ENCOUNTER — Other Ambulatory Visit (HOSPITAL_BASED_OUTPATIENT_CLINIC_OR_DEPARTMENT_OTHER): Payer: Self-pay

## 2024-01-10 DIAGNOSIS — E783 Hyperchylomicronemia: Secondary | ICD-10-CM

## 2024-01-10 DIAGNOSIS — E785 Hyperlipidemia, unspecified: Secondary | ICD-10-CM

## 2024-01-10 DIAGNOSIS — E782 Mixed hyperlipidemia: Secondary | ICD-10-CM

## 2024-01-10 DIAGNOSIS — G720 Drug-induced myopathy: Secondary | ICD-10-CM

## 2024-01-10 MED ORDER — REPATHA SURECLICK 140 MG/ML ~~LOC~~ SOAJ
140.0000 mg | SUBCUTANEOUS | 3 refills | Status: AC
  Filled 2024-01-10 – 2024-01-14 (×2): qty 6, 84d supply, fill #0
  Filled 2024-04-22: qty 6, 84d supply, fill #1
  Filled 2024-07-21: qty 6, 84d supply, fill #2
  Filled 2024-10-09: qty 6, 84d supply, fill #3

## 2024-01-14 ENCOUNTER — Other Ambulatory Visit (HOSPITAL_BASED_OUTPATIENT_CLINIC_OR_DEPARTMENT_OTHER): Payer: Self-pay

## 2024-01-20 ENCOUNTER — Other Ambulatory Visit: Payer: Self-pay

## 2024-01-20 ENCOUNTER — Other Ambulatory Visit (HOSPITAL_BASED_OUTPATIENT_CLINIC_OR_DEPARTMENT_OTHER): Payer: Self-pay

## 2024-01-27 DIAGNOSIS — E7800 Pure hypercholesterolemia, unspecified: Secondary | ICD-10-CM | POA: Diagnosis not present

## 2024-01-27 DIAGNOSIS — E1122 Type 2 diabetes mellitus with diabetic chronic kidney disease: Secondary | ICD-10-CM | POA: Diagnosis not present

## 2024-01-27 DIAGNOSIS — I129 Hypertensive chronic kidney disease with stage 1 through stage 4 chronic kidney disease, or unspecified chronic kidney disease: Secondary | ICD-10-CM | POA: Diagnosis not present

## 2024-01-27 LAB — LAB REPORT - SCANNED
A1c: 6
EGFR: 70

## 2024-01-31 ENCOUNTER — Other Ambulatory Visit (HOSPITAL_BASED_OUTPATIENT_CLINIC_OR_DEPARTMENT_OTHER): Payer: Self-pay

## 2024-02-03 DIAGNOSIS — Z0000 Encounter for general adult medical examination without abnormal findings: Secondary | ICD-10-CM | POA: Diagnosis not present

## 2024-02-03 DIAGNOSIS — Z1339 Encounter for screening examination for other mental health and behavioral disorders: Secondary | ICD-10-CM | POA: Diagnosis not present

## 2024-02-03 DIAGNOSIS — I129 Hypertensive chronic kidney disease with stage 1 through stage 4 chronic kidney disease, or unspecified chronic kidney disease: Secondary | ICD-10-CM | POA: Diagnosis not present

## 2024-02-03 DIAGNOSIS — Z125 Encounter for screening for malignant neoplasm of prostate: Secondary | ICD-10-CM | POA: Diagnosis not present

## 2024-02-03 DIAGNOSIS — R82998 Other abnormal findings in urine: Secondary | ICD-10-CM | POA: Diagnosis not present

## 2024-02-03 DIAGNOSIS — Z1331 Encounter for screening for depression: Secondary | ICD-10-CM | POA: Diagnosis not present

## 2024-02-03 DIAGNOSIS — E1122 Type 2 diabetes mellitus with diabetic chronic kidney disease: Secondary | ICD-10-CM | POA: Diagnosis not present

## 2024-02-03 LAB — LAB REPORT - SCANNED
Albumin, Urine POC: 185.4
Albumin/Creatinine Ratio, Urine, POC: 159
Creatinine, POC: 116.8 mg/dL

## 2024-02-12 ENCOUNTER — Encounter (HOSPITAL_BASED_OUTPATIENT_CLINIC_OR_DEPARTMENT_OTHER): Payer: BC Managed Care – PPO | Admitting: Internal Medicine

## 2024-02-13 ENCOUNTER — Other Ambulatory Visit (HOSPITAL_BASED_OUTPATIENT_CLINIC_OR_DEPARTMENT_OTHER): Payer: Self-pay

## 2024-02-14 ENCOUNTER — Other Ambulatory Visit (HOSPITAL_BASED_OUTPATIENT_CLINIC_OR_DEPARTMENT_OTHER): Payer: Self-pay

## 2024-02-14 ENCOUNTER — Other Ambulatory Visit: Payer: Self-pay | Admitting: Cardiovascular Disease

## 2024-02-14 MED ORDER — ICOSAPENT ETHYL 1 G PO CAPS
2.0000 g | ORAL_CAPSULE | Freq: Two times a day (BID) | ORAL | 3 refills | Status: AC
  Filled 2024-02-14: qty 360, 90d supply, fill #0
  Filled 2024-05-29: qty 360, 90d supply, fill #1
  Filled 2024-09-21: qty 360, 90d supply, fill #2

## 2024-02-17 ENCOUNTER — Encounter: Payer: Self-pay | Admitting: Internal Medicine

## 2024-02-17 NOTE — Telephone Encounter (Signed)
 Please review and advise.

## 2024-02-18 ENCOUNTER — Other Ambulatory Visit (HOSPITAL_BASED_OUTPATIENT_CLINIC_OR_DEPARTMENT_OTHER): Payer: Self-pay

## 2024-02-18 MED ORDER — INSULIN GLARGINE-YFGN 100 UNIT/ML ~~LOC~~ SOPN
22.0000 [IU] | PEN_INJECTOR | Freq: Every day | SUBCUTANEOUS | 3 refills | Status: AC
  Filled 2024-02-18: qty 15, 68d supply, fill #0
  Filled 2024-04-22: qty 15, 68d supply, fill #1
  Filled 2024-07-05: qty 12, 54d supply, fill #2
  Filled 2024-07-09: qty 15, 68d supply, fill #0
  Filled 2024-09-16: qty 15, 68d supply, fill #1

## 2024-02-28 ENCOUNTER — Other Ambulatory Visit (HOSPITAL_BASED_OUTPATIENT_CLINIC_OR_DEPARTMENT_OTHER): Payer: Self-pay

## 2024-03-05 ENCOUNTER — Ambulatory Visit (INDEPENDENT_AMBULATORY_CARE_PROVIDER_SITE_OTHER): Payer: Self-pay | Admitting: Internal Medicine

## 2024-03-05 DIAGNOSIS — Z1379 Encounter for other screening for genetic and chromosomal anomalies: Secondary | ICD-10-CM | POA: Diagnosis not present

## 2024-03-05 NOTE — Progress Notes (Signed)
   Nurse Visit   Date of Encounter: 03/05/2024 ID: Charles [name], DOB October 14, 1970, MRN [id number]  PCP:  Tisovec, Richard W, MD   [facility] Providers Cardiologist:  Jerel Balding, MD      Visit Details   VS:  There were no vitals taken for this visit. , BMI There is no height or weight on file to calculate BMI.  Wt Readings from Last 3 Encounters:  12/23/23 223 lb (101.2 kg)  07/10/23 218 lb (98.9 kg)  03/13/23 217 lb (98.4 kg)     Reason for visit: Genetic testing Performed today: Education Changes (medications, testing, etc.) : no changes Length of Visit: 15 minutes    Medications Adjustments/Labs and Tests Ordered: No orders of the defined types were placed in this encounter.  No orders of the defined types were placed in this encounter.    Signed, Royal Horns, RN  03/11/2024 1:46 PM Genetic test for Familial Hypercholesteremia ordered ([facility]) Cheek swab completed in office Specimen and necessary paperwork mailed. ID: [id number]

## 2024-03-05 NOTE — Patient Instructions (Signed)
 Testing/Procedures: Genetic Test Cheek swab completed in office Specimen and necessary paperwork mailed.   Follow-Up: At [facility], you and your health needs are our priority.  As part of our continuing mission to provide you with exceptional heart care, our providers are all part of one team.  This team includes your primary Cardiologist (physician) and Advanced Practice Providers or APPs (Physician Assistants and Nurse Practitioners) who all work together to provide you with the care you need, when you need it.  Other Instructions Dr. Maximo Spar will reach out with results in 2-3 weeks

## 2024-03-16 ENCOUNTER — Other Ambulatory Visit (HOSPITAL_BASED_OUTPATIENT_CLINIC_OR_DEPARTMENT_OTHER): Payer: Self-pay

## 2024-03-16 MED ORDER — TRUEPLUS PEN NEEDLES 31G X 5 MM MISC
1.0000 | Freq: Four times a day (QID) | 3 refills | Status: AC
  Filled 2024-03-16 – 2024-07-14 (×2): qty 400, 90d supply, fill #0
  Filled 2024-10-17: qty 400, 90d supply, fill #1

## 2024-03-17 ENCOUNTER — Other Ambulatory Visit (HOSPITAL_BASED_OUTPATIENT_CLINIC_OR_DEPARTMENT_OTHER): Payer: Self-pay

## 2024-04-02 ENCOUNTER — Other Ambulatory Visit (HOSPITAL_BASED_OUTPATIENT_CLINIC_OR_DEPARTMENT_OTHER): Payer: Self-pay

## 2024-04-14 ENCOUNTER — Other Ambulatory Visit: Payer: Self-pay

## 2024-04-14 ENCOUNTER — Other Ambulatory Visit (HOSPITAL_BASED_OUTPATIENT_CLINIC_OR_DEPARTMENT_OTHER): Payer: Self-pay

## 2024-04-14 MED ORDER — INSULIN LISPRO (1 UNIT DIAL) 100 UNIT/ML (KWIKPEN)
2.0000 [IU] | PEN_INJECTOR | Freq: Three times a day (TID) | SUBCUTANEOUS | 2 refills | Status: AC
  Filled 2024-04-14: qty 30, 84d supply, fill #0
  Filled 2024-07-05: qty 30, 84d supply, fill #1

## 2024-04-22 ENCOUNTER — Other Ambulatory Visit (HOSPITAL_BASED_OUTPATIENT_CLINIC_OR_DEPARTMENT_OTHER): Payer: Self-pay

## 2024-04-23 ENCOUNTER — Other Ambulatory Visit (HOSPITAL_BASED_OUTPATIENT_CLINIC_OR_DEPARTMENT_OTHER): Payer: Self-pay

## 2024-04-23 ENCOUNTER — Telehealth: Payer: Self-pay | Admitting: Pharmacy Technician

## 2024-04-23 ENCOUNTER — Other Ambulatory Visit (HOSPITAL_COMMUNITY): Payer: Self-pay

## 2024-04-23 NOTE — Telephone Encounter (Signed)
  INSURANCE HAS HIM AS Charles [name]   Pharmacy Patient Advocate Encounter   Received notification from CoverMyMeds that prior authorization for REPATHA  is required/requested.   Insurance verification completed.   The patient is insured through [facility] .   Per test claim: PA required; PA submitted to above mentioned insurance via LATENT Key/confirmation #/EOC [id number] Status is pending

## 2024-04-24 ENCOUNTER — Other Ambulatory Visit: Payer: Self-pay

## 2024-04-24 ENCOUNTER — Other Ambulatory Visit (HOSPITAL_BASED_OUTPATIENT_CLINIC_OR_DEPARTMENT_OTHER): Payer: Self-pay

## 2024-04-24 NOTE — Telephone Encounter (Signed)
 Pharmacy Patient Advocate Encounter  Received notification from [facility] that Prior Authorization for repatha  has been APPROVED from 04/23/24 to 04/23/25

## 2024-04-25 ENCOUNTER — Other Ambulatory Visit (HOSPITAL_BASED_OUTPATIENT_CLINIC_OR_DEPARTMENT_OTHER): Payer: Self-pay

## 2024-05-20 ENCOUNTER — Encounter: Payer: Self-pay | Admitting: *Deleted

## 2024-05-20 DIAGNOSIS — Z006 Encounter for examination for normal comparison and control in clinical research program: Secondary | ICD-10-CM

## 2024-05-20 NOTE — Research (Signed)
 Spoke with Charles [name] informed him that he was getting the drug during the Essence research study.Thanked him for being in the study. Encouraged him to follow up with Dr Mona (he has an appointment later this month).

## 2024-05-25 ENCOUNTER — Encounter: Payer: Self-pay | Admitting: Internal Medicine

## 2024-05-25 ENCOUNTER — Ambulatory Visit: Attending: Internal Medicine | Admitting: Internal Medicine

## 2024-05-25 ENCOUNTER — Telehealth: Payer: Self-pay | Admitting: Internal Medicine

## 2024-05-25 VITALS — BP 128/76 | HR 84 | Ht 72.0 in | Wt 220.0 lb

## 2024-05-25 DIAGNOSIS — E782 Mixed hyperlipidemia: Secondary | ICD-10-CM

## 2024-05-25 DIAGNOSIS — E785 Hyperlipidemia, unspecified: Secondary | ICD-10-CM

## 2024-05-25 DIAGNOSIS — G720 Drug-induced myopathy: Secondary | ICD-10-CM

## 2024-05-25 DIAGNOSIS — E783 Hyperchylomicronemia: Secondary | ICD-10-CM | POA: Diagnosis not present

## 2024-05-25 DIAGNOSIS — T466X5D Adverse effect of antihyperlipidemic and antiarteriosclerotic drugs, subsequent encounter: Secondary | ICD-10-CM

## 2024-05-25 DIAGNOSIS — Z1379 Encounter for other screening for genetic and chromosomal anomalies: Secondary | ICD-10-CM

## 2024-05-25 LAB — LIPID PANEL
Chol/HDL Ratio: 3.8 ratio (ref 0.0–5.0)
Cholesterol, Total: 138 mg/dL (ref 100–199)
HDL: 36 mg/dL — ABNORMAL LOW (ref >39)
LDL Chol Calc (NIH): 43 mg/dL (ref 0–99)
Triglycerides: 404 mg/dL — ABNORMAL HIGH (ref 0–149)
VLDL Cholesterol Cal: 59 mg/dL — ABNORMAL HIGH (ref 5–40)

## 2024-05-25 NOTE — Telephone Encounter (Signed)
 Genetic test for dyslipidemia/ASCVD panel ordered ([facility]) Cheek swab completed in office Specimen and necessary paperwork mailed. ID: [id number]

## 2024-05-25 NOTE — Patient Instructions (Signed)
 Medication Instructions:  NO CHANGES today   *If you need a refill on your cardiac medications before your next appointment, please call your pharmacy*  Lab Work: LIPID PANEL -- 1st floor  If you have labs (blood work) drawn today and your tests are completely normal, you will receive your results only by: MyChart Message (if you have MyChart) OR A paper copy in the mail If you have any lab test that is abnormal or we need to change your treatment, we will call you to review the results.  Testing/Procedures: Genetic Test  Follow-Up: At [facility], you and your health needs are our priority.  As part of our continuing mission to provide you with exceptional heart care, our providers are all part of one team.  This team includes your primary Cardiologist (physician) and Advanced Practice Providers or APPs (Physician Assistants and Nurse Practitioners) who all work together to provide you with the care you need, when you need it.  Your next appointment:    6 months with Dr. Mona  We recommend signing up for the patient portal called MyChart.  Sign up information is provided on this After Visit Summary.  MyChart is used to connect with patients for Virtual Visits (Telemedicine).  Patients are able to view lab/test results, encounter notes, upcoming appointments, etc.  Non-urgent messages can be sent to your provider as well.   To learn more about what you can do with MyChart, go to [email or web address].

## 2024-05-25 NOTE — Progress Notes (Addendum)
 LIPID CLINIC CONSULT NOTE  Chief Complaint:  Follow-up high triglycerides  Primary Care Physician: Tisovec, Charlie ORN, MD  Primary Cardiologist:  Jerel Balding, MD  HPI:  Charles [name] is a 53 y.o. 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 Balding. This is a pleasant 53 year old male with a history of high triglycerides in the past who recently had acute onset diabetes associated with acute kidney injury and pancreatitis.  Triglycerides were markedly elevated in the thousands.  A lipid profile showed total cholesterol 680, triglycerides 2496, HDL of 12 and LDL 62.  He had previously been on fenofibrate  and remotely had been on a statin but that was discontinued by his PCP.  He recently was seen by Dr. Balding in follow-up and started on Vascepa .  He has not been on that medicine for a week without any issues.  He does report heart disease in the family including his father who had CABG in his 52s and had repeat bypass surgery in his 48s.  He is not certain if he had a history of high triglycerides but I suspect this is the case.  He also has 3 children and would like genetic screening today to try to evaluate their risk.  He reports changes in his diet recently to reduce carbohydrates but is not necessarily focused on lower fat.  05/22/2022  Charles [name] returns today for follow-up.  He has subsequently enrolled in the ESSENCE clinical trial evaluating the RNA-inhibitor to APOC3.  When I last saw him he was placed on statin therapy.  During the research trial, routine lab monitoring indicated a very high CK of 3626.  I contacted him about this and he reported being asymptomatic.  Because of the significance of this, I advised him to present to the urgent care in [facility] for IV hydration.  He was evaluated and treated there with a reduction in his CK and subsequent labs performed about a week ago showed his CK had declined down to 528.  He again  remains asymptomatic without any weakness, muscle aches or change in urination.  I had recommended stopping his statin and fibrate when his CK was elevated.  Would not advise retrial with statins however he might be a candidate to add back a fibrate.  12/06/2022  Charles [name] is seen today in follow-up.  He continues in the essence trial.  Unfortunately he had elevated CK as mentioned above.  This had improved but has not been recently rechecked.  Unfortunately he was also taken off the statin for this.  I am blinded to his lipids.  Today says he is doing well although he has had some headache recently.  His blood pressure was elevated today 140/90.  In general he says it has been better controlled.  He is under some stress.  05/25/2024  Charles [name] returns today for follow-up.  He has subsequently completed the essence trial and we found out that he was on a randomization for therapy.  During the study his triglycerides were in the 80s.  He did however have recent lipids in May showing total cholesterol 157, HDL 38, triglycerides 258 and LDL 67.  His triglycerides in the past have been in the 2000's and he did have recurrent pancreatitis episodes but has not had one recently.  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  I have performed genetic testing back in June however something happened with the sample and it  was not processed.  He is interested to see if he might qualify to go on Tryngolza  which was the study drug that is now FDA approved.  To that end he will need repeat genetic testing.  PMHx:  Past Medical History:  Diagnosis Date   Diabetes (HCC)    Hyperlipidemia    Hypertension     Past Surgical History:  Procedure Laterality Date   MASS EXCISION  06/17/2012   Procedure: MINOR EXCISION OF MASS;  Surgeon: Sherlean JINNY Laughter, MD;  Location: [facility];  Service: General;  Laterality: N/A;  Removal Cyst of Right Upper Back    FAMHx:  Family History  Problem  Relation Age of Onset   Lymphoma Mother    Cancer Mother        lymphoma   Heart disease Father     SOCHx:   reports that he has never smoked. He has never used smokeless tobacco. He reports current alcohol  use. He reports that he does not use drugs.  ALLERGIES:  Allergies  Allergen Reactions   Statins Other (See Comments)    Myopathies - elevated CK    ROS: Pertinent items noted in HPI and remainder of comprehensive ROS otherwise negative.  HOME MEDS: Current Outpatient Medications on File Prior to Visit  Medication Sig Dispense Refill   amLODipine  (NORVASC ) 5 MG tablet Take 1 tablet (5 mg total) by mouth daily. 30 tablet 11   Blood Glucose Monitoring Suppl (FREESTYLE LITE) w/Device KIT Use to check blood sugars 3 times daily 1 kit 0   cholecalciferol (VITAMIN D3) 25 MCG (1000 UNIT) tablet Take 2,000 Units by mouth daily.     Evolocumab  (REPATHA  SURECLICK) 140 MG/ML SOAJ Inject 140 mg into the skin every 14 (fourteen) days. 6 mL 3   fenofibrate  (TRICOR ) 48 MG tablet Take 1 tablet (48 mg total) by mouth daily. 90 tablet 3   glucose blood test strip Use to check blood sugars 3 times a day 300 each 5   icosapent  Ethyl (VASCEPA ) 1 g capsule Take 2 capsules (2 g total) by mouth 2 (two) times daily. 360 capsule 3   insulin  glargine-yfgn (SEMGLEE , YFGN,) 100 UNIT/ML Pen Inject 22 Units into the skin daily. 30 mL 3   insulin  lispro (HUMALOG ) 100 UNIT/ML KwikPen Inject 2-12 Units into the skin 3 (three) times daily with meals per sliding scale. BG=180-200: 2u; BG=201-250: 4u; BG=251-300: 6u; BG=3-1-350: 8u; BG=351-400: 10u; and BG=401-450: 12u. 30 mL 2   Insulin  Pen Needle (TRUEPLUS PEN NEEDLES) 31G X 5 MM MISC Inject 1 each into the skin 4 (four) times daily. 400 each 3   Lancets (FREESTYLE) lancets Use as directed to test blood sugars 3 times daily 300 each 5   lisinopril  (ZESTRIL ) 10 MG tablet Take 1 tablet (10 mg total) by mouth daily. 90 tablet 3   Multiple Vitamins-Minerals (CENTURY  PO) Take by mouth.     ketoconazole  (NIZORAL ) 2 % cream Apply 1 application topically 2 (two) times daily for 2 weeks. 30 g 2   No current facility-administered medications on file prior to visit.    LABS/IMAGING: No results found for this or any previous visit (from the past 48 hours). No results found.  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   Charles [name] completed the ESSENCE clinical trial for high triglycerides.  He was randomized to study drug and had improvement in his triglycerides but they have now started to climb again.  I would like to repeat lipids now, but also repeat genetic testing since it was lost to processing at some point in the past.  If there is a genetic triglyceride disorder given his history of recurrent pancreatitis and triglycerides over 2000 in the past, I think he would benefit from being on the FDA approved Tryngolza  APO C3 inhibitor.  Plan follow-up afterwards.  Vinie KYM Maxcy, MD, [facility], FNLA, FACP  [place]  [facility]  Medical Director of the Advanced Lipid Disorders &  Cardiovascular Risk Reduction Clinic Diplomate of the American Board of Clinical Lipidology Attending Cardiologist  Direct Dial : [phone]  Fax: [phone]  Website:  [email or web address]   ADDENDUM (06/18/2024):  I have  received genetic test results on Charles [name]. 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  Additionally there were 5 variants of unknown significance including another variant in LPL (lipoprotein lipase) which is associated with familial combined hyperlipidemia, a disorder of both high triglycerides and LDL.     Overall these findings do explain a genetic basis for high triglycerides. The LIPC gene is not associated with FCS, however, the LPL gene is.  He has had a history of recurrent pancreatitis and triglycerides over 2000 in the past, while adhering to a low fat diet.  He was enrolled in the ESSENCE trial and was randomized to olezarsan. While on medication, he had very low TG's <150 and no episodes of pancreatitis, however, since the study ended, his TG's are increasing (now up to 404).   I will reach out to see if we can get approval for Tryngolza  based on this information.  Tryngolza  Criteria:  1. Adherence to low-fat diet 2. List of current and prior TG-lowering therapies - outcomes/therapeutic failures, etc 3. H/O acute pancreatitis/hospitalizations for this 4. Genetic test results OR scoring with either [place] (>10) or NAFCS (>60)    Vinie JAYSON [name] 05/25/2024, 9:26 AM

## 2024-05-29 ENCOUNTER — Other Ambulatory Visit: Payer: Self-pay

## 2024-05-29 ENCOUNTER — Other Ambulatory Visit (HOSPITAL_BASED_OUTPATIENT_CLINIC_OR_DEPARTMENT_OTHER): Payer: Self-pay

## 2024-05-29 ENCOUNTER — Other Ambulatory Visit: Payer: Self-pay | Admitting: Cardiovascular Disease

## 2024-05-29 MED ORDER — FENOFIBRATE 48 MG PO TABS
48.0000 mg | ORAL_TABLET | Freq: Every day | ORAL | 2 refills | Status: DC
  Filled 2024-05-29 (×2): qty 90, 90d supply, fill #0

## 2024-06-01 ENCOUNTER — Other Ambulatory Visit: Payer: Self-pay

## 2024-06-02 ENCOUNTER — Ambulatory Visit: Payer: Self-pay | Admitting: Internal Medicine

## 2024-06-09 ENCOUNTER — Encounter: Payer: Self-pay | Admitting: Internal Medicine

## 2024-06-17 ENCOUNTER — Other Ambulatory Visit (HOSPITAL_BASED_OUTPATIENT_CLINIC_OR_DEPARTMENT_OTHER): Payer: Self-pay

## 2024-06-18 ENCOUNTER — Other Ambulatory Visit (HOSPITAL_COMMUNITY): Payer: Self-pay

## 2024-06-18 ENCOUNTER — Other Ambulatory Visit: Payer: Self-pay

## 2024-06-18 ENCOUNTER — Other Ambulatory Visit (HOSPITAL_BASED_OUTPATIENT_CLINIC_OR_DEPARTMENT_OTHER): Payer: Self-pay

## 2024-06-18 ENCOUNTER — Other Ambulatory Visit: Payer: Self-pay | Admitting: Internal Medicine

## 2024-06-18 ENCOUNTER — Telehealth: Payer: Self-pay | Admitting: Pharmacy Technician

## 2024-06-18 DIAGNOSIS — E783 Hyperchylomicronemia: Secondary | ICD-10-CM

## 2024-06-18 MED ORDER — TRYNGOLZA 80 MG/0.8ML ~~LOC~~ SOAJ
1.0000 | SUBCUTANEOUS | 3 refills | Status: DC
  Filled 2024-06-18: qty 3, fill #0

## 2024-06-18 NOTE — Telephone Encounter (Signed)
 Pharmacy Patient Advocate Encounter   Received notification from Pt Calls Messages that prior authorization for tryngolza  is required/requested.   Insurance verification completed.   The patient is insured through [facility].   Per test claim: PA required; PA submitted to above mentioned insurance via Latent Key/confirmation #/EOC [facility] Status is pending

## 2024-06-18 NOTE — Addendum Note (Signed)
 Addended by: MONA VINIE BROCKS on: 06/18/2024 01:08 PM   Modules accepted: Orders

## 2024-06-22 NOTE — Telephone Encounter (Signed)
 Faxed more info to [phone] as requested

## 2024-06-23 ENCOUNTER — Other Ambulatory Visit (HOSPITAL_COMMUNITY): Payer: Self-pay

## 2024-06-24 ENCOUNTER — Other Ambulatory Visit (HOSPITAL_COMMUNITY): Payer: Self-pay

## 2024-06-24 NOTE — Telephone Encounter (Signed)
 Pharmacy Patient Advocate Encounter  Received notification from [facility] that Prior Authorization for tryngolza  has been DENIED.  Full denial letter will be uploaded to the media tab. See denial reason below.

## 2024-06-25 ENCOUNTER — Other Ambulatory Visit (HOSPITAL_BASED_OUTPATIENT_CLINIC_OR_DEPARTMENT_OTHER): Payer: Self-pay

## 2024-06-25 MED ORDER — FENOFIBRATE 160 MG PO TABS
160.0000 mg | ORAL_TABLET | Freq: Every day | ORAL | 3 refills | Status: AC
  Filled 2024-06-25: qty 90, 90d supply, fill #0
  Filled 2024-09-27: qty 90, 90d supply, fill #1

## 2024-06-25 NOTE — Addendum Note (Signed)
 Addended by: MONA VINIE BROCKS on: 06/25/2024 12:12 PM   Modules accepted: Orders

## 2024-06-25 NOTE — Addendum Note (Signed)
 Addended by: LORING ANDRIETTE HERO on: 06/25/2024 01:37 PM   Modules accepted: Orders

## 2024-07-06 ENCOUNTER — Other Ambulatory Visit: Payer: Self-pay

## 2024-07-09 ENCOUNTER — Other Ambulatory Visit (HOSPITAL_BASED_OUTPATIENT_CLINIC_OR_DEPARTMENT_OTHER): Payer: Self-pay

## 2024-07-09 ENCOUNTER — Other Ambulatory Visit (HOSPITAL_COMMUNITY): Payer: Self-pay

## 2024-07-14 ENCOUNTER — Other Ambulatory Visit (HOSPITAL_BASED_OUTPATIENT_CLINIC_OR_DEPARTMENT_OTHER): Payer: Self-pay

## 2024-07-20 DIAGNOSIS — I129 Hypertensive chronic kidney disease with stage 1 through stage 4 chronic kidney disease, or unspecified chronic kidney disease: Secondary | ICD-10-CM | POA: Diagnosis not present

## 2024-07-20 DIAGNOSIS — E1122 Type 2 diabetes mellitus with diabetic chronic kidney disease: Secondary | ICD-10-CM | POA: Diagnosis not present

## 2024-07-22 ENCOUNTER — Other Ambulatory Visit (HOSPITAL_BASED_OUTPATIENT_CLINIC_OR_DEPARTMENT_OTHER): Payer: Self-pay

## 2024-08-20 ENCOUNTER — Telehealth: Payer: Self-pay | Admitting: Pharmacy Technician

## 2024-08-20 NOTE — Telephone Encounter (Signed)
   Pharmacy Patient Advocate Encounter   Received notification from CoverMyMeds that prior authorization for vascepa  is required/requested.   Insurance verification completed.   The patient is insured through [facility].   Per test claim: PA required; PA submitted to above mentioned insurance via Latent Key/confirmation #/EOC [id number] Status is pending   Pharmacy Patient Advocate Encounter  Received notification from [facility] that Prior Authorization for vascepa  has been APPROVED from 08/20/24 to 08/20/27   PA #/Case ID/Reference #: [id number]

## 2024-09-02 DIAGNOSIS — E1122 Type 2 diabetes mellitus with diabetic chronic kidney disease: Secondary | ICD-10-CM | POA: Diagnosis not present

## 2024-09-02 DIAGNOSIS — R809 Proteinuria, unspecified: Secondary | ICD-10-CM | POA: Diagnosis not present

## 2024-09-02 DIAGNOSIS — N179 Acute kidney failure, unspecified: Secondary | ICD-10-CM | POA: Diagnosis not present

## 2024-09-02 DIAGNOSIS — N182 Chronic kidney disease, stage 2 (mild): Secondary | ICD-10-CM | POA: Diagnosis not present

## 2024-09-02 DIAGNOSIS — I129 Hypertensive chronic kidney disease with stage 1 through stage 4 chronic kidney disease, or unspecified chronic kidney disease: Secondary | ICD-10-CM | POA: Diagnosis not present

## 2024-09-14 ENCOUNTER — Other Ambulatory Visit (HOSPITAL_BASED_OUTPATIENT_CLINIC_OR_DEPARTMENT_OTHER): Payer: Self-pay

## 2024-09-14 MED ORDER — DAPAGLIFLOZIN PROPANEDIOL 10 MG PO TABS
10.0000 mg | ORAL_TABLET | Freq: Every day | ORAL | 8 refills | Status: AC
  Filled 2024-09-14: qty 30, 30d supply, fill #0
  Filled 2024-10-07: qty 30, 30d supply, fill #1

## 2024-09-16 ENCOUNTER — Other Ambulatory Visit: Payer: Self-pay

## 2024-09-18 ENCOUNTER — Other Ambulatory Visit (HOSPITAL_BASED_OUTPATIENT_CLINIC_OR_DEPARTMENT_OTHER): Payer: Self-pay

## 2024-09-18 ENCOUNTER — Other Ambulatory Visit (HOSPITAL_COMMUNITY): Payer: Self-pay

## 2024-09-21 ENCOUNTER — Other Ambulatory Visit (HOSPITAL_BASED_OUTPATIENT_CLINIC_OR_DEPARTMENT_OTHER): Payer: Self-pay

## 2024-09-22 ENCOUNTER — Other Ambulatory Visit (HOSPITAL_BASED_OUTPATIENT_CLINIC_OR_DEPARTMENT_OTHER): Payer: Self-pay

## 2024-10-09 ENCOUNTER — Other Ambulatory Visit (HOSPITAL_BASED_OUTPATIENT_CLINIC_OR_DEPARTMENT_OTHER): Payer: Self-pay

## 2024-10-18 ENCOUNTER — Other Ambulatory Visit: Payer: Self-pay

## 2024-10-19 ENCOUNTER — Other Ambulatory Visit (HOSPITAL_BASED_OUTPATIENT_CLINIC_OR_DEPARTMENT_OTHER): Payer: Self-pay

## 2024-10-19 ENCOUNTER — Other Ambulatory Visit: Payer: Self-pay

## 2024-10-21 ENCOUNTER — Other Ambulatory Visit (HOSPITAL_BASED_OUTPATIENT_CLINIC_OR_DEPARTMENT_OTHER): Payer: Self-pay

## 2024-12-25 ENCOUNTER — Ambulatory Visit: Admitting: Internal Medicine

## 2025-01-05 ENCOUNTER — Ambulatory Visit: Admitting: Cardiovascular Disease
